# Patient Record
Sex: Female | Born: 1990 | Hispanic: Yes | Marital: Single | Smoking: Never smoker
Health system: Southern US, Community
[De-identification: ages and names within clinical notes are randomized; demographics above are authoritative.]

## PROBLEM LIST (undated history)

## (undated) DIAGNOSIS — D571 Sickle-cell disease without crisis: Secondary | ICD-10-CM

## (undated) DIAGNOSIS — J45909 Unspecified asthma, uncomplicated: Secondary | ICD-10-CM

## (undated) HISTORY — PX: CHOLECYSTECTOMY: SHX55

## (undated) HISTORY — PX: OTHER SURGICAL HISTORY: SHX169

## (undated) HISTORY — PX: PORT-A-CATH REMOVAL: SHX5289

## (undated) HISTORY — PX: INDUCED ABORTION: SHX677

---
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
## 2012-12-23 DIAGNOSIS — Q8901 Asplenia (congenital): Secondary | ICD-10-CM | POA: Insufficient documentation

## 2013-01-01 DIAGNOSIS — Q211 Atrial septal defect: Secondary | ICD-10-CM | POA: Insufficient documentation

## 2015-03-27 DIAGNOSIS — J45909 Unspecified asthma, uncomplicated: Secondary | ICD-10-CM | POA: Insufficient documentation

## 2015-08-19 ENCOUNTER — Inpatient Hospital Stay (HOSPITAL_COMMUNITY)
Admission: EM | Admit: 2015-08-19 | Discharge: 2015-08-22 | DRG: 812 | Disposition: A | Discharge status: Home / Self Care | Payer: Self-pay | Attending: Internal Medicine | Admitting: Internal Medicine

## 2015-08-19 ENCOUNTER — Encounter (HOSPITAL_COMMUNITY): Payer: Self-pay | Admitting: Emergency Medicine

## 2015-08-19 ENCOUNTER — Observation Stay (HOSPITAL_COMMUNITY): Payer: Self-pay

## 2015-08-19 ENCOUNTER — Emergency Department (HOSPITAL_COMMUNITY): Payer: Self-pay

## 2015-08-19 DIAGNOSIS — I959 Hypotension, unspecified: Secondary | ICD-10-CM | POA: Diagnosis present

## 2015-08-19 DIAGNOSIS — D72829 Elevated white blood cell count, unspecified: Secondary | ICD-10-CM | POA: Diagnosis present

## 2015-08-19 DIAGNOSIS — J45909 Unspecified asthma, uncomplicated: Secondary | ICD-10-CM | POA: Diagnosis present

## 2015-08-19 DIAGNOSIS — R0902 Hypoxemia: Secondary | ICD-10-CM | POA: Diagnosis present

## 2015-08-19 DIAGNOSIS — D571 Sickle-cell disease without crisis: Secondary | ICD-10-CM

## 2015-08-19 DIAGNOSIS — D638 Anemia in other chronic diseases classified elsewhere: Secondary | ICD-10-CM | POA: Diagnosis present

## 2015-08-19 DIAGNOSIS — E860 Dehydration: Secondary | ICD-10-CM | POA: Diagnosis present

## 2015-08-19 DIAGNOSIS — Z832 Family history of diseases of the blood and blood-forming organs and certain disorders involving the immune mechanism: Secondary | ICD-10-CM

## 2015-08-19 DIAGNOSIS — D5700 Hb-SS disease with crisis, unspecified: Principal | ICD-10-CM | POA: Diagnosis present

## 2015-08-19 DIAGNOSIS — T402X5A Adverse effect of other opioids, initial encounter: Secondary | ICD-10-CM | POA: Diagnosis present

## 2015-08-19 DIAGNOSIS — G43909 Migraine, unspecified, not intractable, without status migrainosus: Secondary | ICD-10-CM | POA: Diagnosis present

## 2015-08-19 HISTORY — DX: Sickle-cell disease without crisis: D57.1

## 2015-08-19 HISTORY — DX: Unspecified asthma, uncomplicated: J45.909

## 2015-08-19 LAB — POC URINE PREG, ED: Preg Test, Ur: NEGATIVE

## 2015-08-19 LAB — CBC WITH DIFFERENTIAL/PLATELET
BLASTS: 0 %
Band Neutrophils: 0 %
Basophils Absolute: 0.2 10*3/uL — ABNORMAL HIGH (ref 0.0–0.1)
Basophils Relative: 1 %
Eosinophils Absolute: 1.6 10*3/uL — ABNORMAL HIGH (ref 0.0–0.7)
Eosinophils Relative: 10 %
HEMATOCRIT: 19.7 % — AB (ref 36.0–46.0)
HEMOGLOBIN: 7.1 g/dL — AB (ref 12.0–15.0)
LYMPHS PCT: 40 %
Lymphs Abs: 6.2 10*3/uL — ABNORMAL HIGH (ref 0.7–4.0)
MCH: 32.7 pg (ref 26.0–34.0)
MCHC: 36 g/dL (ref 30.0–36.0)
MCV: 90.8 fL (ref 78.0–100.0)
METAMYELOCYTES PCT: 0 %
MONOS PCT: 6 %
Monocytes Absolute: 0.9 10*3/uL (ref 0.1–1.0)
Myelocytes: 2 %
NEUTROS ABS: 6.7 10*3/uL (ref 1.7–7.7)
Neutrophils Relative %: 41 %
OTHER: 0 %
PROMYELOCYTES ABS: 0 %
Platelets: 324 10*3/uL (ref 150–400)
RBC: 2.17 MIL/uL — AB (ref 3.87–5.11)
RDW: 29.7 % — ABNORMAL HIGH (ref 11.5–15.5)
WBC: 15.6 10*3/uL — AB (ref 4.0–10.5)
nRBC: 7 /100 WBC — ABNORMAL HIGH

## 2015-08-19 LAB — PREPARE RBC (CROSSMATCH)

## 2015-08-19 LAB — COMPREHENSIVE METABOLIC PANEL
ALT: 28 U/L (ref 14–54)
ANION GAP: 6 (ref 5–15)
AST: 75 U/L — ABNORMAL HIGH (ref 15–41)
Albumin: 4.4 g/dL (ref 3.5–5.0)
Alkaline Phosphatase: 76 U/L (ref 38–126)
BILIRUBIN TOTAL: 4.5 mg/dL — AB (ref 0.3–1.2)
BUN: 6 mg/dL (ref 6–20)
CO2: 25 mmol/L (ref 22–32)
Calcium: 9 mg/dL (ref 8.9–10.3)
Chloride: 108 mmol/L (ref 101–111)
Creatinine, Ser: <0.3 mg/dL — ABNORMAL LOW (ref 0.44–1.00)
Glucose, Bld: 108 mg/dL — ABNORMAL HIGH (ref 65–99)
POTASSIUM: 3.9 mmol/L (ref 3.5–5.1)
Sodium: 139 mmol/L (ref 135–145)
TOTAL PROTEIN: 7.8 g/dL (ref 6.5–8.1)

## 2015-08-19 LAB — URINALYSIS, ROUTINE W REFLEX MICROSCOPIC
Bilirubin Urine: NEGATIVE
Glucose, UA: NEGATIVE mg/dL
Hgb urine dipstick: NEGATIVE
Ketones, ur: NEGATIVE mg/dL
LEUKOCYTES UA: NEGATIVE
NITRITE: NEGATIVE
Protein, ur: NEGATIVE mg/dL
SPECIFIC GRAVITY, URINE: 1.013 (ref 1.005–1.030)
pH: 6 (ref 5.0–8.0)

## 2015-08-19 LAB — RETICULOCYTES
RBC.: 2.17 MIL/uL — ABNORMAL LOW (ref 3.87–5.11)
Retic Ct Pct: >23 % — ABNORMAL HIGH (ref 0.4–3.1)

## 2015-08-19 LAB — D-DIMER, QUANTITATIVE (NOT AT ARMC): D DIMER QUANT: 1.35 ug{FEU}/mL — AB (ref 0.00–0.50)

## 2015-08-19 LAB — LACTATE DEHYDROGENASE: LDH: 593 U/L — ABNORMAL HIGH (ref 98–192)

## 2015-08-19 MED ORDER — ALBUTEROL SULFATE (2.5 MG/3ML) 0.083% IN NEBU: 2.5000 mg | INHALATION_SOLUTION | Freq: Four times a day (QID) | RESPIRATORY_TRACT | Status: DC | PRN

## 2015-08-19 MED ORDER — SODIUM CHLORIDE 0.9 % IV BOLUS (SEPSIS)
1000.0000 mL | Freq: Once | INTRAVENOUS | Status: AC
  Administered 2015-08-19: 1000 mL via INTRAVENOUS

## 2015-08-19 MED ORDER — SENNOSIDES-DOCUSATE SODIUM 8.6-50 MG PO TABS
1.0000 | ORAL_TABLET | Freq: Two times a day (BID) | ORAL | Status: DC
  Administered 2015-08-19 – 2015-08-21 (×5): 1 via ORAL
  Filled 2015-08-19 (×6): qty 1

## 2015-08-19 MED ORDER — NALOXONE HCL 0.4 MG/ML IJ SOLN: 0.4000 mg | INTRAMUSCULAR | Status: DC | PRN

## 2015-08-19 MED ORDER — HYDROMORPHONE HCL 1 MG/ML IJ SOLN
1.0000 mg | Freq: Once | INTRAMUSCULAR | Status: AC
  Administered 2015-08-19: 1 mg via INTRAVENOUS
  Filled 2015-08-19: qty 1

## 2015-08-19 MED ORDER — ENOXAPARIN SODIUM 40 MG/0.4ML ~~LOC~~ SOLN
40.0000 mg | SUBCUTANEOUS | Status: DC
  Administered 2015-08-19 – 2015-08-21 (×3): 40 mg via SUBCUTANEOUS
  Filled 2015-08-19 (×3): qty 0.4

## 2015-08-19 MED ORDER — DIPHENHYDRAMINE HCL 25 MG PO CAPS: 25.0000 mg | ORAL_CAPSULE | Freq: Four times a day (QID) | ORAL | Status: DC | PRN

## 2015-08-19 MED ORDER — SODIUM CHLORIDE 0.9% FLUSH: 9.0000 mL | INTRAVENOUS | Status: DC | PRN

## 2015-08-19 MED ORDER — HYDROMORPHONE HCL 1 MG/ML IJ SOLN
1.0000 mg | INTRAMUSCULAR | Status: AC | PRN
  Administered 2015-08-19 (×2): 1 mg via INTRAVENOUS
  Filled 2015-08-19 (×2): qty 1

## 2015-08-19 MED ORDER — KETOROLAC TROMETHAMINE 30 MG/ML IJ SOLN
30.0000 mg | Freq: Once | INTRAMUSCULAR | Status: AC
  Administered 2015-08-19: 30 mg via INTRAVENOUS
  Filled 2015-08-19: qty 1

## 2015-08-19 MED ORDER — ONDANSETRON HCL 4 MG/2ML IJ SOLN
4.0000 mg | Freq: Once | INTRAMUSCULAR | Status: AC
  Administered 2015-08-19: 4 mg via INTRAVENOUS
  Filled 2015-08-19: qty 2

## 2015-08-19 MED ORDER — POLYETHYLENE GLYCOL 3350 17 G PO PACK: 17.0000 g | PACK | Freq: Every day | ORAL | Status: DC | PRN

## 2015-08-19 MED ORDER — DEXTROSE-NACL 5-0.45 % IV SOLN
INTRAVENOUS | Status: DC
  Administered 2015-08-19: 14:00:00 via INTRAVENOUS

## 2015-08-19 MED ORDER — HYDROMORPHONE HCL 1 MG/ML IJ SOLN
0.5000 mg | Freq: Once | INTRAMUSCULAR | Status: AC
  Administered 2015-08-19: 0.5 mg via INTRAVENOUS
  Filled 2015-08-19: qty 1

## 2015-08-19 MED ORDER — HYDROMORPHONE 1 MG/ML IV SOLN
INTRAVENOUS | Status: DC
  Administered 2015-08-19: 14:00:00 via INTRAVENOUS
  Administered 2015-08-19: 4 mg via INTRAVENOUS
  Administered 2015-08-19: 1 mg via INTRAVENOUS
  Administered 2015-08-19: 0.5 mg via INTRAVENOUS
  Administered 2015-08-20: 1.5 mg via INTRAVENOUS
  Administered 2015-08-20: 0.5 mg via INTRAVENOUS
  Administered 2015-08-20: 1.5 mg via INTRAVENOUS
  Filled 2015-08-19: qty 25

## 2015-08-19 MED ORDER — ONDANSETRON HCL 4 MG/2ML IJ SOLN
4.0000 mg | Freq: Four times a day (QID) | INTRAMUSCULAR | Status: DC | PRN
  Administered 2015-08-19 – 2015-08-22 (×7): 4 mg via INTRAVENOUS
  Filled 2015-08-19 (×7): qty 2

## 2015-08-19 MED ORDER — IOPAMIDOL (ISOVUE-370) INJECTION 76%
100.0000 mL | Freq: Once | INTRAVENOUS | Status: AC | PRN
  Administered 2015-08-19: 100 mL via INTRAVENOUS

## 2015-08-19 MED ORDER — HYDROXYUREA 500 MG PO CAPS
1000.0000 mg | ORAL_CAPSULE | Freq: Every day | ORAL | Status: DC
  Administered 2015-08-19 – 2015-08-20 (×2): 1000 mg via ORAL
  Filled 2015-08-19 (×2): qty 2

## 2015-08-19 MED ORDER — HYDROCODONE-ACETAMINOPHEN 5-325 MG PO TABS
1.0000 | ORAL_TABLET | Freq: Once | ORAL | Status: AC
  Administered 2015-08-19: 1 via ORAL
  Filled 2015-08-19: qty 1

## 2015-08-19 MED ORDER — KETOROLAC TROMETHAMINE 30 MG/ML IJ SOLN
30.0000 mg | Freq: Four times a day (QID) | INTRAMUSCULAR | Status: DC
  Administered 2015-08-19 – 2015-08-22 (×11): 30 mg via INTRAVENOUS
  Filled 2015-08-19 (×11): qty 1

## 2015-08-19 NOTE — ED Notes (Signed)
Pt can go to room at 09:35.

## 2015-08-19 NOTE — ED Notes (Signed)
Pt vomited x 1 while in Xray.  Pt reports that back pain is shooting into R posterior head/behind R ear.  Sts this pain is "normal" when she is in crisis.

## 2015-08-19 NOTE — ED Provider Notes (Addendum)
PROGRESS NOTE                                                                                                                 This is a sign-out from NP Tomasa BlaseSchultz at shift change: Valerie Reid is a 25 y.o. 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 Patient afebrile, no signs of acute chest. Plan is to follow-up blood work and reassess pain.  Please refer to previous note for full HPI, ROS, PMH and PE.   Informed by RN the patient's blood pressure is soft with systolic in the upper 80s, chart review and care everywhere with review of [facility] shows that her systolic is always low in the low 90s. Gave the verbal okay to administer Dilaudid.   7:15 AM: Patent seen and evaluated the bedside, she is resting comfortably. She states that her pain is improved from 10 out of 10-8 out of 10, it in the bilateral thighs which is typical for her sickle cell pain crises. She's had 2 doses of Dilaudid so far, she is on 2 L via nasal cannula, stats that this was started because she felt short of breath. I have removed the oxygen, she is talking to me and alert, her oxygen saturation has dipped to he mid 80s, she is asymptomatic, oxygen is reapplied, given a verbal command to hold any more narcotics, will give Zofran she states she's feeling nauseous and obtain a chest x-ray, patient denies cough, chest pain.   Patient states that her care is at [facility], [place] and has been followed there on her life, [place] and for continuity of care she continues to get care [facility].  Chest x-ray with no acute abnormality, when patient returned from chest x-ray as per nurse her O2 sats were 74% on room air with good waveform. Patient does not know her baseline hemoglobin, chart review shows she  was 8.1 in March 2017, given this patient's lack of chest pain, palpitation, physical exam is not consistent with DVT, I doubt this is a PE, I topics in this patient's best interest to expose her to the radiation to evaluate this. I think her hypoxia is likely secondary to her low hemoglobin from acute sickle cell crises, will transfuse and admit.   Discussed with attending physician who agrees with care plan.   Discussed case with triad hospitalist Dr. Dartha Lodgegbata, who accepts admission to a telemetry bed, request that we draw a d-dimer in the ED which she will follow-up.  Discussed with Dr. Ashley RoyaltyMatthews at Dr. Tennis Mustbota's request, she states that she does sickle cell patients, states that her hypoxia is likely not secondary to her anemia and she is not tachycardic request that we further workup for hypoxia with d-dimer and call again for admission when we have ruled in or out PE.  Gave verbal request for nurse to hold PRBCs. D-dimer elevated, will obtain CT angio.   CT and it was negative for PE, they do show some chronic swelling. Discussed with Dr. Ashley Royalty who accepts admission to a non-telemetry bed, patient will be given orders for MedSurg, will put in for oxygen and cardiac monitoring with pulse ox monitoring.   Wynetta Emery, PA-C 08/19/15 [id number]  Dione Booze, MD 08/19/15 [id number]  Wynetta Emery, PA-C 08/19/15 1144  Gerhard Munch, MD 08/19/15 [phone]

## 2015-08-19 NOTE — ED Notes (Signed)
Per Joni ReiningNicole PA, Pt dropped to 80% O2 on room air.

## 2015-08-19 NOTE — ED Notes (Signed)
Per Joni ReiningNicole PA and Hospitalist, Pt will hold in the ED until after CT Angio results.

## 2015-08-19 NOTE — ED Notes (Signed)
Patient transported to X-ray 

## 2015-08-19 NOTE — Progress Notes (Signed)
UR completed [employer] [facility] [employer] 

## 2015-08-19 NOTE — H&P (Signed)
Hospital Admission Note Date: 08/19/2015  Patient name: Valerie Reid Medical record number: [id number] Date of birth: 1990-03-15 Age: 25 y.o. Gender: female PCP: No PCP Per Patient  Attending physician: Altha Harm, MD  Chief Complaint: Pain in B/L "sides", back and bilateral lower extremities 1 week.  History of Present Illness: The patient with hemoglobin SS was normally followed at [facility] and due to our hospital. She presents today with complaints of pain in her bilateral rib, low back and bilateral legs. She rates pain currently at 8/10 and states that the highest intensity has been 10/10. She describes the pain as throbbing in nature and intermittently sharp. Pain is nonradiating and she was unsuccessful in decreasing her pain with the use of her oral opiates. The patient is essentially opiate nave and takes her oral Dilaudid 2 mg on a very infrequent as-needed basis. Otherwise she uses Aleve for relief of her pain. She reports that her pain at baseline is usually 3/10. Patient also reports that she had one episode of emesis this morning which was self-limited and she is able to tolerate oral intake currently. She's had no fever, chills, nausea, diarrhea, chest pain, headaches or focal neurological changes.  In the emergency department she was reported by the attending nurse practitioner to have a hypoxemia of 74% without oxygen. However during my visit with the patient her oxygen saturations remained 92% on room. She had a d-dimer performed which was elevated 1.35 and a subsequent CT angiogram to evaluate for pulmonary embolus. The CT angiographic was negative for any evidence of pulmonary embolus.  A review of her records from [facility] shows that her hemoglobin is at baseline at around 7.1. 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 Her record shows that she's had a history of acute chest syndrome in the past and at that time was transfused several units of blood. The patient has not received a blood transfusion since February 2016.  Scheduled Meds: Continuous Infusions: PRN Meds:. Allergies: Review of patient's allergies indicates no known allergies. Past Medical History  Diagnosis Date  . Sickle cell anemia (HCC)   . Asthma    Past Surgical History  Procedure Laterality Date  . Cholecystectomy    . Port-a-cath removal    . Port-a-cath insertion     . Induced abortion N/A    Family History  Problem Relation Age of Onset  . Sickle cell anemia Sister   . Sickle cell trait Mother   . Sickle cell trait Father    Social History   Social History  . Marital Status: Single    Spouse Name: N/A  . Number of Children: N/A  . Years of Education: N/A   Occupational History  . Not on file.   Social History Main Topics  . Smoking status: Never Smoker   . Smokeless tobacco: Not on file  . Alcohol Use: No  . Drug Use: No  . Sexual Activity: Yes   Other Topics Concern  . Not on file   Social History Narrative  . No narrative on file   Review of Systems: A comprehensive review of systems was negative except as noted in the history of present illness.  Physical Exam: No intake or output data in the 24 hours ending 08/19/15 1226 General: Alert, awake, oriented x3, in mild distress secondary to pain.  HEENT: [place]/AT PEERL, EOMI, mild icterus  present Neck: Trachea midline,  no masses, no thyromegal,y no JVD, no carotid bruit OROPHARYNX:  Moist, No exudate/ erythema/lesions.  Heart: Regular rate and rhythm, without murmurs, rubs, gallops, PMI non-displaced, no heaves or thrills on palpation.  Lungs: Clear to auscultation, no wheezing or rhonchi noted. No increased vocal fremitus resonant to percussion. Abdomen: Soft, nontender, nondistended, positive bowel sounds, no masses no hepatosplenomegaly  noted.  Neuro: No focal neurological deficits noted cranial nerves II through XII grossly intact.  Strength at functional baseline in bilateral upper and lower extremities. Musculoskeletal: No warmth swelling or erythema around joints, no spinal tenderness noted. Psychiatric: Patient alert and oriented x3, good insight and cognition, good recent to remote recall.   Lab results:  Recent Labs  08/19/15 0445  NA 139  K 3.9  CL 108  CO2 25  GLUCOSE 108*  BUN 6  CREATININE <0.30*  CALCIUM 9.0    Recent Labs  08/19/15 0445  AST 75*  ALT 28  ALKPHOS 76  BILITOT 4.5*  PROT 7.8  ALBUMIN 4.4   No results for input(s): LIPASE, AMYLASE in the last 72 hours.  Recent Labs  08/19/15 0445  WBC 15.6*  NEUTROABS 6.7  HGB 7.1*  HCT 19.7*  MCV 90.8  PLT 324   No results for input(s): CKTOTAL, CKMB, CKMBINDEX, TROPONINI in the last 72 hours. Invalid input(s): POCBNP  Recent Labs  08/19/15 0445  DDIMER 1.35*   No results for input(s): HGBA1C in the last 72 hours. No results for input(s): CHOL, HDL, LDLCALC, TRIG, CHOLHDL, LDLDIRECT in the last 72 hours. No results for input(s): TSH, T4TOTAL, T3FREE, THYROIDAB in the last 72 hours.  Invalid input(s): FREET3  Recent Labs  08/19/15 0445  RETICCTPCT >23.0*   Imaging results:  Dg Chest 2 View  08/19/2015  CLINICAL DATA:  Sickle sent pain/ crisis with worsening last 2 days EXAM: CHEST  2 VIEW COMPARISON:  03/27/2015 FINDINGS: Cardiomediastinal silhouette is stable. Again noted bilateral upper lobe suprahilar streaky scarring and some architectural distortion. Stable streaky scarring right infrahilar region. No definite superimposed infiltrate or pulmonary edema. No pleural effusion or pneumothorax. Bony thorax is unremarkable. IMPRESSION: Again noted bilateral upper lobe suprahilar streaky scarring and some architectural distortion. Stable streaky scarring right infrahilar region. No definite superimposed infiltrate or pulmonary  edema. Electronically Signed   By: Natasha MeadLiviu  Pop M.D.   On: 08/19/2015 07:58   Ct Angio Chest Pe W/cm &/or Wo Cm  08/19/2015  CLINICAL DATA:  Chronic sickle cell pain, elevated D-dimer EXAM: CT ANGIOGRAPHY CHEST WITH CONTRAST TECHNIQUE: Multidetector CT imaging of the chest was performed using the standard protocol during bolus administration of intravenous contrast. Multiplanar CT image reconstructions and MIPs were obtained to evaluate the vascular anatomy. CONTRAST:  100 cc Isovue COMPARISON:  None. FINDINGS: Images of the thoracic inlet are unremarkable. Central airways are patent. No mediastinal hematoma or adenopathy. Borderline cardiomegaly. No pulmonary embolus is noted. Precarinal lymph node measures 7 mm short-axis. Right hilar lymph node measures 9 mm short-axis. Left hilar lymph node measures 9 mm short-axis. These are borderline enlarged by size criteria. No pulmonary embolus is noted.  There is no aortic aneurysm. Images of the lung parenchyma shows architectural distortion bilateral upper lobes. There is linear interstitial thickening traction and cylindrical bronchiectasis in right upper lobe anteriorly. Pleural parenchymal scarring/ fibrotic changes are noted also in left apex. Similar fibrotic changes with linear interstitial thickening in right lower lobe posterior laterally please see axial image 44. There is no segmental  infiltrate or pulmonary edema. Air trapping noted in right lower lobe medially. Findings are suspicious for fibrotic changes and sequela from prior infection or infarction in this patient with known sickle cell disease and probable prior pulmonary infection. No evidence of interstitial nodularity to suggest sore choroid. Clinical correlation is necessary. There is no evidence of pulmonary edema. Sagittal images of the spine shows no destructive bony lesions. There are sickle cell sequela with H-shaped vertebrae T5 vertebral body T8 and T9 vertebral body. 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 Review of the MIP images confirms the above findings. IMPRESSION: 1. No pulmonary embolus is noted. 2. Borderline cardiomegaly.  Borderline bilateral hilar adenopathy. 3. There is architectural distortion and bandlike fibrotic changes bilateral upper lobe and right lower lobe. Some traction and cylindrical bronchiectasis noted in right upper lobe anteriorly. Pleural parenchymal fibrotic changes noted in left upper lobe/apex with some central bronchiectasis. Findings highly suspicious for fibrotic changes probable sequelae from prior pulmonary infections or lung infarct in this patient with known sickle cell disease. Less likely sarcoid. Clinical correlation is necessary. Air trapping noted in right lower lobe medially. 4. No segmental infiltrate or pulmonary edema. 5. There are sickle cell sequela with H-shaped vertebrae T5 vertebral body T8 and T9 vertebral body. 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 Electronically Signed   By: Natasha Mead M.D.   On: 08/19/2015 11:28    Assessment and Plan:  Hb SS with Crisis: Patient's pain is markedly elevated at this time. She has received 2 doses of Dilaudid and 1 dose of Toradol in the emergency room. I will start the patient on a Dilaudid PCA with a PCA bolus of 0.5 mg, 10 minute lockout and one hour total of 3 mg. I will also continue Toradol and IV fluids.  Anemia of chronic disease: Clinical examination is consistent with acute hemolysis. I will check an LDH level for baseline. Her hemoglobin is at baseline however she does have a robust reticulocytosis thus I do not expect a significant drop in her hemoglobin.  Mild dehydration: Patient's clinical examination is consistent with a mild degree of dehydration. She's received significant amount of fluids and I will continue her fluids until she is able to maintain her hydration orally. She's had one episode of emesis and  no far no further emesis. Will place on a regular diet.  DVT prophylaxis: Subcutaneous Lovenox.   Hypoxemia: I suspect that her hypoxemia is in large part due to hypoventilation. We'll ensure that she has an incentive spirometer with appropriate use. We'll continue to supplement her oxygen for now and reassess her oxygenation in the morning.  Total time spent 1 hour. Greater than 50% of the time spent in assessment, counseling and coordination of care.   Daliah Chaudoin A. 08/19/2015, 12:26 PM

## 2015-08-19 NOTE — ED Notes (Signed)
Patient transported to CT 

## 2015-08-19 NOTE — ED Provider Notes (Signed)
CSN: [id number]     Arrival date & time 08/19/15  0344 History   First MD Initiated Contact with Patient 08/19/15 0450     Chief Complaint  Patient presents with  . Sickle Cell Pain Crisis     (Consider location/radiation/quality/duration/timing/severity/associated sxs/prior Treatment) HPI Comments: This a 25 year old female with reported history of sickle cell disease.  She is unsure which type.  She states that she has not had to be hospitalized in a year for crisis.  She is [facility] clinic [facility] where she sees her physician every 3 months.  She is due to be seen again in July.  She states for the past 2 days.  She's had extremity back pain which is typical of her crises.  She has been taken her Norco Dilaudid tablets.  Hydrea as well as folic acid, without relief.  She states she has asthma but is not short of breath at this time.  She denies any chest pain.  She has no history of chest syndrome  Patient is a 25 y.o. female presenting with sickle cell pain. The history is provided by the patient.  Sickle Cell Pain Crisis Location:  Upper extremity and lower extremity Severity:  Moderate Onset quality:  Gradual Duration:  2 days Similar to previous crisis episodes: yes   Timing:  Constant Progression:  Worsening Chronicity:  Chronic Sickle cell genotype:  Unable to specify History of pulmonary emboli: no   Context: not change in medication, not dehydration, not infection, not non-compliance, not menses and not pregnancy   Associated symptoms: no chest pain, no cough, no fever and no shortness of breath     Past Medical History  Diagnosis Date  . Sickle cell anemia (HCC)   . Asthma    Past Surgical History  Procedure Laterality Date  . Cholecystectomy    . Port-a-cath removal    . Port-a-cath insertion     . Induced abortion N/A    Family History  Problem Relation Age of Onset  . Sickle cell anemia Sister   .  Sickle cell trait Mother   . Sickle cell trait Father    Social History  Substance Use Topics  . Smoking status: Never Smoker   . Smokeless tobacco: None  . Alcohol Use: No   OB History    No data available     Review of Systems  Constitutional: Negative for fever and chills.  Respiratory: Negative for cough and shortness of breath.   Cardiovascular: Negative for chest pain.  Gastrointestinal: Negative for abdominal pain.  Musculoskeletal: Positive for arthralgias. Negative for joint swelling.  Skin: Negative for rash and wound.  All other systems reviewed and are negative.     Allergies  Review of patient's allergies indicates no known allergies.  Home Medications   Prior to Admission medications   Medication Sig Start Date End Date Taking? Authorizing Provider  albuterol (PROVENTIL HFA;VENTOLIN HFA) 108 (90 Base) MCG/ACT inhaler Inhale 1-2 puffs into the lungs every 6 (six) hours as needed for wheezing or shortness of breath.   Yes Historical Provider, MD  HYDROcodone-acetaminophen (NORCO) 10-325 MG tablet Take 1 tablet by mouth every 6 (six) hours as needed for moderate pain.   Yes Historical Provider, MD  HYDROmorphone HCl (DILAUDID PO) Take 2 mg by mouth every 6 (six) hours as needed (pain).    Yes Historical Provider, MD  hydroxyurea (HYDREA) 500 MG capsule Take 1,500 mg by mouth daily. May take  with food to minimize GI side effects.   Yes Historical Provider, MD  OXYCODONE ER PO Take 1 tablet by mouth every 4 (four) hours.   Yes Historical Provider, MD   BP 87/49 mmHg  Pulse 57  Temp(Src) 98.7 F (37.1 C) (Oral)  Resp 14  Ht 5\' 2"  (1.575 m)  Wt 49.714 kg  BMI 20.04 kg/m2  SpO2 100%  LMP 08/08/2015 (Exact Date) Physical Exam  Constitutional: She is oriented to person, place, and time. She appears well-developed and well-nourished.  HENT:  Head: Normocephalic.  Eyes: Pupils are equal, round, and reactive to light.  Neck: Normal range of motion.   Cardiovascular: Normal rate and regular rhythm.   Pulmonary/Chest: Effort normal and breath sounds normal.  Abdominal: Soft. Bowel sounds are normal.  Musculoskeletal: Normal range of motion. She exhibits no edema or tenderness.  Neurological: She is alert and oriented to person, place, and time.  Skin: Skin is warm and dry.  Psychiatric: She has a normal mood and affect.  Nursing note and vitals reviewed.   ED Course  Procedures (including critical care time) Labs Review Labs Reviewed  COMPREHENSIVE METABOLIC PANEL - Abnormal; Notable for the following:    Glucose, Bld 108 (*)    Creatinine, Ser <0.30 (*)    AST 75 (*)    Total Bilirubin 4.5 (*)    All other components within normal limits  CBC WITH DIFFERENTIAL/PLATELET - Abnormal; Notable for the following:    WBC 15.6 (*)    RBC 2.17 (*)    Hemoglobin 7.1 (*)    HCT 19.7 (*)    RDW 29.7 (*)    nRBC 7 (*)    Lymphs Abs 6.2 (*)    Eosinophils Absolute 1.6 (*)    Basophils Absolute 0.2 (*)    All other components within normal limits  RETICULOCYTES - Abnormal; Notable for the following:    Retic Ct Pct >23.0 (*)    RBC. 2.17 (*)    All other components within normal limits  URINALYSIS, ROUTINE W REFLEX MICROSCOPIC (NOT AT [facility]) - Abnormal; Notable for the following:    Color, Urine AMBER (*)    APPearance CLOUDY (*)    All other components within normal limits  D-DIMER, QUANTITATIVE (NOT AT [facility]) - Abnormal; Notable for the following:    D-Dimer, Quant 1.35 (*)    All other components within normal limits  LACTATE DEHYDROGENASE - Abnormal; Notable for the following:    LDH 593 (*)    All other components within normal limits  CBC WITH DIFFERENTIAL/PLATELET - Abnormal; Notable for the following:    RBC 1.95 (*)    Hemoglobin 6.3 (*)    HCT 17.2 (*)    MCHC 36.6 (*)    RDW 28.6 (*)    nRBC 28 (*)    Monocytes Absolute 1.4 (*)    All other components within normal limits  CBC WITH DIFFERENTIAL/PLATELET -  Abnormal; Notable for the following:    RBC 1.85 (*)    Hemoglobin 5.9 (*)    HCT 16.4 (*)    RDW 28.7 (*)    nRBC 61 (*)    Lymphs Abs 4.8 (*)    All other components within normal limits  RETICULOCYTES - Abnormal; Notable for the following:    Retic Ct Pct >23.0 (*)    RBC. 1.85 (*)    All other components within normal limits  LACTATE DEHYDROGENASE - Abnormal; Notable for the following:    LDH 579 (*)  All other components within normal limits  CBC WITH DIFFERENTIAL/PLATELET  COMPREHENSIVE METABOLIC PANEL  POC URINE PREG, ED  TYPE AND SCREEN  PREPARE RBC (CROSSMATCH)  PREPARE RBC (CROSSMATCH)    Imaging Review No results found. I have personally reviewed and evaluated these images and lab results as part of my medical decision-making.   EKG Interpretation None      MDM   Final diagnoses:  Hypoxia  Hb-SS disease without crisis (HCC)         Earley Favor, NP 08/21/15 2026  Dione Booze, MD 08/23/15 [phone]

## 2015-08-19 NOTE — ED Notes (Signed)
Pt states she is has sickle cell and feels like she is in crisis  Pt states the pain is really bad in her lower back, sides and in her legs when she walks  Pt states her pain started 2 days ago  Pt states she took dilaudid at home without relief

## 2015-08-19 NOTE — ED Notes (Signed)
PA at bedside.

## 2015-08-20 DIAGNOSIS — G43909 Migraine, unspecified, not intractable, without status migrainosus: Secondary | ICD-10-CM

## 2015-08-20 LAB — PREPARE RBC (CROSSMATCH)

## 2015-08-20 LAB — CBC WITH DIFFERENTIAL/PLATELET
BASOS PCT: 1 %
Basophils Absolute: 0.1 10*3/uL (ref 0.0–0.1)
EOS PCT: 6 %
Eosinophils Absolute: 0.6 10*3/uL (ref 0.0–0.7)
HEMATOCRIT: 17.2 % — AB (ref 36.0–46.0)
HEMOGLOBIN: 6.3 g/dL — AB (ref 12.0–15.0)
LYMPHS ABS: 3.6 10*3/uL (ref 0.7–4.0)
LYMPHS PCT: 37 %
MCH: 32.3 pg (ref 26.0–34.0)
MCHC: 36.6 g/dL — ABNORMAL HIGH (ref 30.0–36.0)
MCV: 88.2 fL (ref 78.0–100.0)
MONO ABS: 1.4 10*3/uL — AB (ref 0.1–1.0)
Monocytes Relative: 14 %
NRBC: 28 /100{WBCs} — AB
Neutro Abs: 4 10*3/uL (ref 1.7–7.7)
Neutrophils Relative %: 42 %
Platelets: ADEQUATE 10*3/uL (ref 150–400)
RBC: 1.95 MIL/uL — ABNORMAL LOW (ref 3.87–5.11)
RDW: 28.6 % — AB (ref 11.5–15.5)
WBC: 9.7 10*3/uL (ref 4.0–10.5)

## 2015-08-20 MED ORDER — DEXTROSE-NACL 5-0.45 % IV SOLN
INTRAVENOUS | Status: DC
  Administered 2015-08-20 – 2015-08-22 (×3): via INTRAVENOUS

## 2015-08-20 MED ORDER — SODIUM CHLORIDE 0.9 % IV SOLN: Freq: Once | INTRAVENOUS | Status: DC

## 2015-08-20 MED ORDER — HYDROMORPHONE 1 MG/ML IV SOLN
INTRAVENOUS | Status: DC
  Administered 2015-08-20: 0 mg via INTRAVENOUS
  Administered 2015-08-21: 0.6 mg via INTRAVENOUS
  Administered 2015-08-21: 2.1 mg via INTRAVENOUS
  Administered 2015-08-21: 0.6 mg via INTRAVENOUS
  Administered 2015-08-21: 1.5 mg via INTRAVENOUS
  Administered 2015-08-21: 0 mg via INTRAVENOUS
  Administered 2015-08-21: 1.5 mg via INTRAVENOUS
  Administered 2015-08-22: 0 mg via INTRAVENOUS
  Administered 2015-08-22: 0.9 mg via INTRAVENOUS
  Administered 2015-08-22: 2.1 mg via INTRAVENOUS

## 2015-08-20 MED ORDER — SODIUM CHLORIDE 0.9 % IV BOLUS (SEPSIS): 1000.0000 mL | Freq: Once | INTRAVENOUS | Status: AC

## 2015-08-20 MED ORDER — HYDROMORPHONE HCL 2 MG PO TABS
2.0000 mg | ORAL_TABLET | Freq: Four times a day (QID) | ORAL | Status: DC
  Administered 2015-08-20 – 2015-08-22 (×7): 2 mg via ORAL
  Filled 2015-08-20 (×8): qty 1

## 2015-08-20 MED ORDER — SUMATRIPTAN SUCCINATE 50 MG PO TABS
50.0000 mg | ORAL_TABLET | Freq: Once | ORAL | Status: AC
  Administered 2015-08-20: 50 mg via ORAL
  Filled 2015-08-20: qty 1

## 2015-08-20 MED ORDER — PROMETHAZINE HCL 25 MG PO TABS
12.5000 mg | ORAL_TABLET | Freq: Once | ORAL | Status: AC
  Administered 2015-08-20: 12.5 mg via ORAL
  Filled 2015-08-20: qty 1

## 2015-08-20 MED ORDER — SODIUM CHLORIDE 0.9 % IV BOLUS (SEPSIS)
1000.0000 mL | Freq: Once | INTRAVENOUS | Status: AC
  Administered 2015-08-20: 1000 mL via INTRAVENOUS

## 2015-08-20 MED ORDER — ACETAMINOPHEN 325 MG PO TABS
650.0000 mg | ORAL_TABLET | ORAL | Status: DC | PRN
  Administered 2015-08-20 – 2015-08-22 (×5): 650 mg via ORAL
  Filled 2015-08-20 (×5): qty 2

## 2015-08-20 NOTE — Progress Notes (Signed)
Patient ID: Valerie Reid, female   DOB: 1990-03-30, 25 y.o.   MRN: [id number] Called by nurse for patient with low blood pressure and respirations. Arrived at bedside and patient awake, and oriented x 3. States that she is just waking up and actually feels good. She does admit to some mild dizziness with sitting up. But states that her pain is better.   Pt has used only 1.5 mg of Dilaudid in the last 2 hours. She ash had good urine output for today.   Focused Examination: Repeat BP in sitting 97/58, HR 82, RR 9 and Saturations 98% on 3 L/min. Gen: Pt awake and oriented x 3. Initially sleepy but then fully awake and engaging in conversation.  HEENT: Mild icterus. PERRLA, Pupils 3mm in size. Lungs: CTA COR: S1 S2 normal EXT: Warm and dry and capillary refill <3 seconds.  A/P : Transient Hypotension: A [facility] shows BP normally 97-98/50-54. BP now back to baseline. Likely multifactorial due to patient sleeping, and effects of opiates.  Will decrease PCA bolus and continue IVF. Check Hb and order RBC's prepared in event of need for transfusion.  Continue with plan as previously outlined.   Kaydon Husby A.

## 2015-08-20 NOTE — Progress Notes (Signed)
SICKLE CELL SERVICE PROGRESS NOTE  Valerie Reid ZOX:[id number] DOB: 02/06/1991 DOA: 08/19/2015 PCP: No PCP Per Patient  Assessment/Plan: Active Problems:   Hypoxia   Sickle cell crisis (HCC)   Hb-SS disease with crisis (HCC)   Anemia of chronic disease   Mild dehydration  1. Hb SS with crisis: Will schedule oral Dilaudid and continue PCA for PRN use. Continue Toradol and IVF since she has still had some emesis and poor oral intake.  2. Migraine Headache: Tylenol prescribed. If no relief, will give Imitrex.  3. Leukocytosis: Likely related to crisis. No evidence of infection.  4. Anemia of chronic Disease: Check Hb tomorrow.   Code Status: Full Code Family Communication: N/A Disposition Plan: Not yet ready for discharge  MATTHEWS,MICHELLE A.  Pager [phone]. If 7PM-7AM, please contact night-coverage.  08/20/2015, 11:14 AM  LOS: 1 day   Interim History: Pt reports a severe headache which is worsened by IV Dilaudid. She also reports that her nausea is worse after Dilaudid and she has had 2 episodes of emesis since yesterday. She has used only 5 mg with 10/10:semands/deliveries since yesterday.   Consultants:  None  Procedures:  None  Antibiotics:  None   Objective: Filed Vitals:   08/20/15 0345 08/20/15 0628 08/20/15 0755 08/20/15 1008  BP:  95/66  94/48  Pulse:  82  72  Temp:  98.2 F (36.8 C)  98.7 F (37.1 C)  TempSrc:  Oral  Oral  Resp: 18 11 10 10   Height:      Weight:      SpO2: 94% 97% 95% 96%   Weight change: 0 lb (0 kg)  Intake/Output Summary (Last 24 hours) at 08/20/15 1114 Last data filed at 08/20/15 1041  Gross per 24 hour  Intake      0 ml  Output   1000 ml  Net  -1000 ml    General: Alert, awake, oriented x3, in moderate distress secondary to pain and nausea.  HEENT: [place]/AT PEERL, EOMI, anicteric. Neck: Trachea midline,  no masses, no thyromegal,y no JVD, no carotid bruit OROPHARYNX:  Moist, No exudate/ erythema/lesions.  Heart:  Regular rate and rhythm, without murmurs, rubs, gallops, PMI non-displaced, no heaves or thrills on palpation.  Lungs: Clear to auscultation, no wheezing or rhonchi noted. No increased vocal fremitus resonant to percussion  Abdomen: Soft, nontender, nondistended, positive bowel sounds, no masses no hepatosplenomegaly noted..  Neuro: No focal neurological deficits noted cranial nerves II through XII grossly intact.  Strength at functional baseline in bilateral upper and lower extremities. Musculoskeletal: No warm swelling or erythema around joints, no spinal tenderness noted. Psychiatric: Patient alert and oriented x3, good insight and cognition, good recent to remote recall.    Data Reviewed: Basic Metabolic Panel:  Recent Labs Lab 08/19/15 0445  NA 139  K 3.9  CL 108  CO2 25  GLUCOSE 108*  BUN 6  CREATININE <0.30*  CALCIUM 9.0   Liver Function Tests:  Recent Labs Lab 08/19/15 0445  AST 75*  ALT 28  ALKPHOS 76  BILITOT 4.5*  PROT 7.8  ALBUMIN 4.4   No results for input(s): LIPASE, AMYLASE in the last 168 hours. No results for input(s): AMMONIA in the last 168 hours. CBC:  Recent Labs Lab 08/19/15 0445  WBC 15.6*  NEUTROABS 6.7  HGB 7.1*  HCT 19.7*  MCV 90.8  PLT 324   Cardiac Enzymes: No results for input(s): CKTOTAL, CKMB, CKMBINDEX, TROPONINI in the last 168 hours. BNP (last 3 results) No  results for input(s): BNP in the last 8760 hours.  ProBNP (last 3 results) No results for input(s): PROBNP in the last 8760 hours.  CBG: No results for input(s): GLUCAP in the last 168 hours.  No results found for this or any previous visit (from the past 240 hour(s)).   Studies: Dg Chest 2 View  08/19/2015  CLINICAL DATA:  Sickle sent pain/ crisis with worsening last 2 days EXAM: CHEST  2 VIEW COMPARISON:  03/27/2015 FINDINGS: Cardiomediastinal silhouette is stable. Again noted bilateral upper lobe suprahilar streaky scarring and some architectural distortion.  Stable streaky scarring right infrahilar region. No definite superimposed infiltrate or pulmonary edema. No pleural effusion or pneumothorax. Bony thorax is unremarkable. IMPRESSION: Again noted bilateral upper lobe suprahilar streaky scarring and some architectural distortion. Stable streaky scarring right infrahilar region. No definite superimposed infiltrate or pulmonary edema. Electronically Signed   By: Natasha Mead M.D.   On: 08/19/2015 07:58   Ct Angio Chest Pe W/cm &/or Wo Cm  08/19/2015  CLINICAL DATA:  Chronic sickle cell pain, elevated D-dimer EXAM: CT ANGIOGRAPHY CHEST WITH CONTRAST TECHNIQUE: Multidetector CT imaging of the chest was performed using the standard protocol during bolus administration of intravenous contrast. Multiplanar CT image reconstructions and MIPs were obtained to evaluate the vascular anatomy. CONTRAST:  100 cc Isovue COMPARISON:  None. FINDINGS: Images of the thoracic inlet are unremarkable. Central airways are patent. No mediastinal hematoma or adenopathy. Borderline cardiomegaly. No pulmonary embolus is noted. Precarinal lymph node measures 7 mm short-axis. Right hilar lymph node measures 9 mm short-axis. Left hilar lymph node measures 9 mm short-axis. These are borderline enlarged by size criteria. No pulmonary embolus is noted.  There is no aortic aneurysm. Images of the lung parenchyma shows architectural distortion bilateral upper lobes. There is linear interstitial thickening traction and cylindrical bronchiectasis in right upper lobe anteriorly. Pleural parenchymal scarring/ fibrotic changes are noted also in left apex. Similar fibrotic changes with linear interstitial thickening in right lower lobe posterior laterally please see axial image 44. There is no segmental infiltrate or pulmonary edema. Air trapping noted in right lower lobe medially. Findings are suspicious for fibrotic changes and sequela from prior infection or infarction in this patient with known sickle  cell disease and probable prior pulmonary infection. No evidence of interstitial nodularity to suggest sore choroid. Clinical correlation is necessary. There is no evidence of pulmonary edema. Sagittal images of the spine shows no destructive bony lesions. There are sickle cell sequela with H-shaped vertebrae T5 vertebral body T8 and T9 vertebral body. 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 Review of the MIP images confirms the above findings. IMPRESSION: 1. No pulmonary embolus is noted. 2. Borderline cardiomegaly.  Borderline bilateral hilar adenopathy. 3. There is architectural distortion and bandlike fibrotic changes bilateral upper lobe and right lower lobe. Some traction and cylindrical bronchiectasis noted in right upper lobe anteriorly. Pleural parenchymal fibrotic changes noted in left upper lobe/apex with some central bronchiectasis. Findings highly suspicious for fibrotic changes probable sequelae from prior pulmonary infections or lung infarct in this patient with known sickle cell disease. Less likely sarcoid. Clinical correlation is necessary. Air trapping noted in right lower lobe medially. 4. No segmental infiltrate or pulmonary edema. 5. There are sickle cell sequela with H-shaped vertebrae T5 vertebral body T8 and T9 vertebral body. 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 Electronically Signed   By: Natasha Mead M.D.   On: 08/19/2015 11:28  Scheduled Meds: . enoxaparin (LOVENOX) injection  40 mg Subcutaneous Q24H  . HYDROmorphone   Intravenous Q4H  . hydroxyurea  1,000 mg Oral Daily  . ketorolac  30 mg Intravenous Q6H  . senna-docusate  1 tablet Oral BID  . SUMAtriptan  50 mg Oral Once   Continuous Infusions: . dextrose 5 % and 0.45% NaCl 75 mL/hr at 08/19/15 1333    Time spent 30 minutes and greater than  50% involved face to face contact with the patient for assessment, counseling and coordination of  care

## 2015-08-21 LAB — CBC WITH DIFFERENTIAL/PLATELET
BASOS ABS: 0 10*3/uL (ref 0.0–0.1)
BASOS PCT: 0 %
EOS ABS: 0.3 10*3/uL (ref 0.0–0.7)
Eosinophils Relative: 4 %
HCT: 16.4 % — ABNORMAL LOW (ref 36.0–46.0)
Hemoglobin: 5.9 g/dL — CL (ref 12.0–15.0)
LYMPHS ABS: 4.8 10*3/uL — AB (ref 0.7–4.0)
LYMPHS PCT: 57 %
MCH: 31.9 pg (ref 26.0–34.0)
MCHC: 36 g/dL (ref 30.0–36.0)
MCV: 88.6 fL (ref 78.0–100.0)
MONO ABS: 0.7 10*3/uL (ref 0.1–1.0)
Monocytes Relative: 8 %
NEUTROS PCT: 31 %
Neutro Abs: 2.6 10*3/uL (ref 1.7–7.7)
PLATELETS: 290 10*3/uL (ref 150–400)
RBC: 1.85 MIL/uL — AB (ref 3.87–5.11)
RDW: 28.7 % — AB (ref 11.5–15.5)
WBC: 8.4 10*3/uL (ref 4.0–10.5)
nRBC: 61 /100 WBC — ABNORMAL HIGH

## 2015-08-21 LAB — RETICULOCYTES: RBC.: 1.85 MIL/uL — AB (ref 3.87–5.11)

## 2015-08-21 LAB — LACTATE DEHYDROGENASE: LDH: 579 U/L — AB (ref 98–192)

## 2015-08-21 MED ORDER — SODIUM CHLORIDE 0.9 % IV SOLN
Freq: Once | INTRAVENOUS | Status: AC
  Administered 2015-08-21: 17:00:00 via INTRAVENOUS

## 2015-08-21 MED ORDER — SODIUM CHLORIDE 0.9 % IV SOLN: Freq: Once | INTRAVENOUS | Status: AC

## 2015-08-21 NOTE — Progress Notes (Signed)
PHARMACY BRIEF NOTE: HYDROXYUREA   By [facility] policy, hydroxyurea is automatically held when any of the following laboratory values occur:  ANC < 2 K  Pltc < 80K in sickle-cell patients; < 100K in other patients  Hgb <= 6 in sickle-cell patients; < 8 in other patients  Reticulocytes < 80K when Hgb < 9  Hydroxyurea has been held (discontinued from profile) per policy.   Hgb=5.9  Arley Phenixllen Connor Foxworthy RPh 08/21/2015, 7:34 AM Pager [phone]

## 2015-08-21 NOTE — Progress Notes (Signed)
SICKLE CELL SERVICE PROGRESS NOTE  Valerie Reid EAV:[id number] DOB: 1990-12-16 DOA: 08/19/2015 PCP: No PCP Per Patient  Assessment/Plan: Active Problems:   Hypoxia   Sickle cell crisis (HCC)   Hb-SS disease with crisis (HCC)   Anemia of chronic disease   Mild dehydration  1. Hb SS with crisis: Will continue oral Dilaudid and continue PCA for PRN use. Continue Toradol and IVF.   2. Migraine Headache: This seems resolved. 3. Leukocytosis: Likely related to crisis. No evidence of infection. Continue to monitor 4. Anemia of chronic Disease: Hemoglobin has dropped. I will given 1 unit of packed red blood cells especially in the setting of her hypoxemia.   Code Status: Full Code Family Communication: N/A Disposition Plan: Not yet ready for discharge  [facility] Center[facility]AR[facility]WAL  Pager [phone]. If 7PM-7AM, please contact night-coverage.  08/21/2015, 7:23 AM  LOS: 2 days   Interim History: Pt  Feels much better today. She has no headaches and pain is down to 2 out of 3. Hemoglobin however has dropped to 5.9g. No significant shortness of breath no cough  Consultants:  None  Procedures:  None  Antibiotics:  None   Objective: Filed Vitals:   08/20/15 2313 08/20/15 2343 08/21/15 0218 08/21/15 0400  BP:   87/47   Pulse:   81   Temp:   98 F (36.7 C)   TempSrc:   Oral   Resp: 12 12 14 15   Height:      Weight:   49.714 kg (109 lb 9.6 oz)   SpO2: 95% 95% 95% 98%   Weight change: -0.181 kg (-6.4 oz)  Intake/Output Summary (Last 24 hours) at 08/21/15 0723 Last data filed at 08/21/15 0215  Gross per 24 hour  Intake    715 ml  Output   1400 ml  Net   -685 ml    General: Alert, awake, oriented x3, in moderate distress secondary to pain and nausea.  HEENT: [place]/AT PEERL, EOMI, anicteric. Neck: Trachea midline,  no masses, no thyromegal,y no JVD, no carotid bruit OROPHARYNX:  Moist, No exudate/ erythema/lesions.  Heart: Regular rate and rhythm, without murmurs, rubs, gallops, PMI  non-displaced, no heaves or thrills on palpation.  Lungs: Clear to auscultation, no wheezing or rhonchi noted. No increased vocal fremitus resonant to percussion  Abdomen: Soft, nontender, nondistended, positive bowel sounds, no masses no hepatosplenomegaly noted..  Neuro: No focal neurological deficits noted cranial nerves II through XII grossly intact.  Strength at functional baseline in bilateral upper and lower extremities. Musculoskeletal: No warm swelling or erythema around joints, no spinal tenderness noted. Psychiatric: Patient alert and oriented x3, good insight and cognition, good recent to remote recall.    Data Reviewed: Basic Metabolic Panel:  Recent Labs Lab 08/19/15 0445  NA 139  K 3.9  CL 108  CO2 25  GLUCOSE 108*  BUN 6  CREATININE <0.30*  CALCIUM 9.0   Liver Function Tests:  Recent Labs Lab 08/19/15 0445  AST 75*  ALT 28  ALKPHOS 76  BILITOT 4.5*  PROT 7.8  ALBUMIN 4.4   No results for input(s): LIPASE, AMYLASE in the last 168 hours. No results for input(s): AMMONIA in the last 168 hours. CBC:  Recent Labs Lab 08/19/15 0445 08/20/15 1523 08/21/15 0618  WBC 15.6* 9.7 8.4  NEUTROABS 6.7 4.0 2.6  HGB 7.1* 6.3* 5.9*  HCT 19.7* 17.2* 16.4*  MCV 90.8 88.2 88.6  PLT 324 PLATELET CLUMPS NOTED ON SMEAR, COUNT APPEARS ADEQUATE 290   Cardiac Enzymes: No results  for input(s): CKTOTAL, CKMB, CKMBINDEX, TROPONINI in the last 168 hours. BNP (last 3 results) No results for input(s): BNP in the last 8760 hours.  ProBNP (last 3 results) No results for input(s): PROBNP in the last 8760 hours.  CBG: No results for input(s): GLUCAP in the last 168 hours.  No results found for this or any previous visit (from the past 240 hour(s)).   Studies: Dg Chest 2 View  08/19/2015  CLINICAL DATA:  Sickle sent pain/ crisis with worsening last 2 days EXAM: CHEST  2 VIEW COMPARISON:  03/27/2015 FINDINGS: Cardiomediastinal silhouette is stable. Again noted bilateral  upper lobe suprahilar streaky scarring and some architectural distortion. Stable streaky scarring right infrahilar region. No definite superimposed infiltrate or pulmonary edema. No pleural effusion or pneumothorax. Bony thorax is unremarkable. IMPRESSION: Again noted bilateral upper lobe suprahilar streaky scarring and some architectural distortion. Stable streaky scarring right infrahilar region. No definite superimposed infiltrate or pulmonary edema. Electronically Signed   By: Natasha Mead M.D.   On: 08/19/2015 07:58   Ct Angio Chest Pe W/cm &/or Wo Cm  08/19/2015  CLINICAL DATA:  Chronic sickle cell pain, elevated D-dimer EXAM: CT ANGIOGRAPHY CHEST WITH CONTRAST TECHNIQUE: Multidetector CT imaging of the chest was performed using the standard protocol during bolus administration of intravenous contrast. Multiplanar CT image reconstructions and MIPs were obtained to evaluate the vascular anatomy. CONTRAST:  100 cc Isovue COMPARISON:  None. FINDINGS: Images of the thoracic inlet are unremarkable. Central airways are patent. No mediastinal hematoma or adenopathy. Borderline cardiomegaly. No pulmonary embolus is noted. Precarinal lymph node measures 7 mm short-axis. Right hilar lymph node measures 9 mm short-axis. Left hilar lymph node measures 9 mm short-axis. These are borderline enlarged by size criteria. No pulmonary embolus is noted.  There is no aortic aneurysm. Images of the lung parenchyma shows architectural distortion bilateral upper lobes. There is linear interstitial thickening traction and cylindrical bronchiectasis in right upper lobe anteriorly. Pleural parenchymal scarring/ fibrotic changes are noted also in left apex. Similar fibrotic changes with linear interstitial thickening in right lower lobe posterior laterally please see axial image 44. There is no segmental infiltrate or pulmonary edema. Air trapping noted in right lower lobe medially. Findings are suspicious for fibrotic changes and  sequela from prior infection or infarction in this patient with known sickle cell disease and probable prior pulmonary infection. No evidence of interstitial nodularity to suggest sore choroid. Clinical correlation is necessary. There is no evidence of pulmonary edema. Sagittal images of the spine shows no destructive bony lesions. There are sickle cell sequela with H-shaped vertebrae T5 vertebral body T8 and T9 vertebral body. 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 Review of the MIP images confirms the above findings. IMPRESSION: 1. No pulmonary embolus is noted. 2. Borderline cardiomegaly.  Borderline bilateral hilar adenopathy. 3. There is architectural distortion and bandlike fibrotic changes bilateral upper lobe and right lower lobe. Some traction and cylindrical bronchiectasis noted in right upper lobe anteriorly. Pleural parenchymal fibrotic changes noted in left upper lobe/apex with some central bronchiectasis. Findings highly suspicious for fibrotic changes probable sequelae from prior pulmonary infections or lung infarct in this patient with known sickle cell disease. Less likely sarcoid. Clinical correlation is necessary. Air trapping noted in right lower lobe medially. 4. No segmental infiltrate or pulmonary edema. 5. There are sickle cell sequela with H-shaped vertebrae T5 vertebral body T8 and T9 vertebral body. 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 Electronically  Signed   By: Natasha Mead M.D.   On: 08/19/2015 11:28    Scheduled Meds: . sodium chloride   Intravenous Once  . enoxaparin (LOVENOX) injection  40 mg Subcutaneous Q24H  . HYDROmorphone   Intravenous Q4H  . HYDROmorphone  2 mg Oral Q6H  . hydroxyurea  1,000 mg Oral Daily  . ketorolac  30 mg Intravenous Q6H  . senna-docusate  1 tablet Oral BID   Continuous Infusions: . dextrose 5 % and 0.45% NaCl 75 mL/hr at 08/20/15 1906    Time spent 30 minutes and  greater than  50% involved face to face contact with the patient for assessment, counseling and coordination of care

## 2015-08-22 LAB — COMPREHENSIVE METABOLIC PANEL
ALBUMIN: 3.6 g/dL (ref 3.5–5.0)
ALT: 54 U/L (ref 14–54)
ANION GAP: 4 — AB (ref 5–15)
AST: 85 U/L — ABNORMAL HIGH (ref 15–41)
Alkaline Phosphatase: 60 U/L (ref 38–126)
BUN: <5 mg/dL — ABNORMAL LOW (ref 6–20)
CO2: 29 mmol/L (ref 22–32)
Calcium: 8.4 mg/dL — ABNORMAL LOW (ref 8.9–10.3)
Chloride: 106 mmol/L (ref 101–111)
GLUCOSE: 122 mg/dL — AB (ref 65–99)
Potassium: 3.7 mmol/L (ref 3.5–5.1)
SODIUM: 139 mmol/L (ref 135–145)
Total Bilirubin: 3.6 mg/dL — ABNORMAL HIGH (ref 0.3–1.2)
Total Protein: 6.5 g/dL (ref 6.5–8.1)

## 2015-08-22 LAB — CBC WITH DIFFERENTIAL/PLATELET
BASOS ABS: 0 10*3/uL (ref 0.0–0.1)
Basophils Relative: 0 %
EOS PCT: 3 %
Eosinophils Absolute: 0.2 10*3/uL (ref 0.0–0.7)
HCT: 21.8 % — ABNORMAL LOW (ref 36.0–46.0)
HEMOGLOBIN: 7.8 g/dL — AB (ref 12.0–15.0)
LYMPHS PCT: 76 %
Lymphs Abs: 5.2 10*3/uL — ABNORMAL HIGH (ref 0.7–4.0)
MCH: 31 pg (ref 26.0–34.0)
MCHC: 35.8 g/dL (ref 30.0–36.0)
MCV: 86.5 fL (ref 78.0–100.0)
MONOS PCT: 6 %
Monocytes Absolute: 0.4 10*3/uL (ref 0.1–1.0)
NEUTROS ABS: 1 10*3/uL — AB (ref 1.7–7.7)
NEUTROS PCT: 15 %
Platelets: 302 10*3/uL (ref 150–400)
RBC: 2.52 MIL/uL — AB (ref 3.87–5.11)
RDW: 24.3 % — ABNORMAL HIGH (ref 11.5–15.5)
WBC: 6.8 10*3/uL (ref 4.0–10.5)
nRBC: 90 /100 WBC — ABNORMAL HIGH

## 2015-08-22 NOTE — Progress Notes (Signed)
Patient reports that she cannot urinate.  Bladder scan reveals 393 mls.

## 2015-08-22 NOTE — Discharge Summary (Signed)
Physician Discharge Summary  Patient ID: Valerie MaduraYaneth Wilborn MRN: [id number] DOB/AGE: 08-08-1990 25 y.o.  Admit date: 08/19/2015 Discharge date: 08/22/2015  Admission Diagnoses:  Discharge Diagnoses:  Active Problems:   Hypoxia   Sickle cell crisis (HCC)   Hb-SS disease with crisis (HCC)   Anemia of chronic disease   Mild dehydration   Discharged Condition: good  Hospital Course: The patient with hemoglobin SS [facility] and presented to our hospital with complaints of pain in her bilateral rib cage as well as low back and legs. It was 8 out of 10 at the time of admission but goal is as high as 10 out of 10. Was associated with nausea vomiting. Patient was also found to be hypoxemic while admitted in the hospital. She is relatively opiate tolerant I was placed on Dilaudid PCA with Toradol and IV fluids. Baseline hemoglobin was at 7 g but daughter hospitalization it dropped to 5.9 g and she was transfused one unit of packed red blood cells. This brought a hemoglobin of about 7 and her pain was down to 2 out of 10 so she was discharged home to follow up with her primary care physician as well as her hematologist.  Consults: None  Significant Diagnostic Studies: labs: Serial CBCs and CMP as well as checked. She was type and crossmatch for blood transfusions.  Treatments: IV hydration, analgesia: Dilaudid and blood transfusion of packed red blood cells  Discharge Exam: Blood pressure 91/48, pulse 59, temperature 98.7 F (37.1 C), temperature source Oral, resp. rate 8, height 5\' 2"  (1.575 m), weight 49.805 kg (109 lb 12.8 oz), last menstrual period 08/08/2015, SpO2 99 %. General appearance: alert, cooperative and no distress Eyes: conjunctivae/corneas clear. PERRL, EOM's intact. Fundi benign. Resp: clear to auscultation bilaterally Chest wall: no tenderness Cardio: regular rate and rhythm, S1, S2 normal, no murmur, click, rub or  gallop GI: soft, non-tender; bowel sounds normal; no masses,  no organomegaly Extremities: extremities normal, atraumatic, no cyanosis or edema Pulses: 2+ and symmetric Skin: Skin color, texture, turgor normal. No rashes or lesions Neurologic: Grossly normal  Disposition: Patient discharged home to follow up with primary care physician     Medication List    STOP taking these medications        OXYCODONE ER PO      TAKE these medications        albuterol 108 (90 Base) MCG/ACT inhaler  Commonly known as:  PROVENTIL HFA;VENTOLIN HFA  Inhale 1-2 puffs into the lungs every 6 (six) hours as needed for wheezing or shortness of breath.     DILAUDID PO  Take 2 mg by mouth every 6 (six) hours as needed (pain).     HYDROcodone-acetaminophen 10-325 MG tablet  Commonly known as:  NORCO  Take 1 tablet by mouth every 6 (six) hours as needed for moderate pain.     hydroxyurea 500 MG capsule  Commonly known as:  HYDREA  Take 1,500 mg by mouth daily. May take with food to minimize GI side effects.         SignedLonia Blood: Montina Dorrance,LAWAL 08/22/2015, 7:11 AM  Time spent 35 minutes

## 2015-08-22 NOTE — Progress Notes (Signed)
Pt had the dry heaves and brought up about 20 ml clear liquid. StaTES IT'S BECAUSE SHE HASN'T EATEN. Boyfriend in to take pt home . Discharge instructions gone over with pt, no prescriptions given. Discharged via wheelchair

## 2015-08-23 LAB — TYPE AND SCREEN
ABO/RH(D): O POS
Antibody Screen: NEGATIVE
UNIT DIVISION: 0
Unit division: 0

## 2015-12-06 ENCOUNTER — Emergency Department (HOSPITAL_COMMUNITY)
Admission: EM | Admit: 2015-12-06 | Discharge: 2015-12-06 | Disposition: A | Discharge status: Home / Self Care | Payer: No Typology Code available for payment source | Attending: Emergency Medicine | Admitting: Emergency Medicine

## 2015-12-06 ENCOUNTER — Emergency Department (HOSPITAL_COMMUNITY): Payer: No Typology Code available for payment source

## 2015-12-06 ENCOUNTER — Encounter (HOSPITAL_COMMUNITY): Payer: Self-pay | Admitting: Emergency Medicine

## 2015-12-06 DIAGNOSIS — S161XXA Strain of muscle, fascia and tendon at neck level, initial encounter: Secondary | ICD-10-CM | POA: Diagnosis not present

## 2015-12-06 DIAGNOSIS — Y939 Activity, unspecified: Secondary | ICD-10-CM | POA: Insufficient documentation

## 2015-12-06 DIAGNOSIS — Y999 Unspecified external cause status: Secondary | ICD-10-CM | POA: Insufficient documentation

## 2015-12-06 DIAGNOSIS — J45909 Unspecified asthma, uncomplicated: Secondary | ICD-10-CM | POA: Insufficient documentation

## 2015-12-06 DIAGNOSIS — M545 Low back pain: Secondary | ICD-10-CM | POA: Insufficient documentation

## 2015-12-06 DIAGNOSIS — S199XXA Unspecified injury of neck, initial encounter: Secondary | ICD-10-CM | POA: Diagnosis present

## 2015-12-06 DIAGNOSIS — Y92410 Unspecified street and highway as the place of occurrence of the external cause: Secondary | ICD-10-CM | POA: Insufficient documentation

## 2015-12-06 LAB — CBC WITH DIFFERENTIAL/PLATELET
Band Neutrophils: 0 %
Basophils Absolute: 0.1 K/uL (ref 0.0–0.1)
Basophils Relative: 2 %
Blasts: 0 %
Eosinophils Absolute: 0.2 K/uL (ref 0.0–0.7)
Eosinophils Relative: 3 %
HCT: 22.1 % — ABNORMAL LOW (ref 36.0–46.0)
Hemoglobin: 8 g/dL — ABNORMAL LOW (ref 12.0–15.0)
Lymphocytes Relative: 53 %
Lymphs Abs: 2.9 K/uL (ref 0.7–4.0)
MCH: 39 pg — ABNORMAL HIGH (ref 26.0–34.0)
MCHC: 36.2 g/dL — ABNORMAL HIGH (ref 30.0–36.0)
MCV: 107.8 fL — ABNORMAL HIGH (ref 78.0–100.0)
Metamyelocytes Relative: 0 %
Monocytes Absolute: 0.4 K/uL (ref 0.1–1.0)
Monocytes Relative: 7 %
Myelocytes: 0 %
Neutro Abs: 2 K/uL (ref 1.7–7.7)
Neutrophils Relative %: 35 %
Other: 0 %
Platelets: 206 K/uL (ref 150–400)
Promyelocytes Absolute: 0 %
RBC: 2.05 MIL/uL — ABNORMAL LOW (ref 3.87–5.11)
RDW: 24.4 % — ABNORMAL HIGH (ref 11.5–15.5)
WBC: 5.6 K/uL (ref 4.0–10.5)
nRBC: 13 /100{WBCs} — ABNORMAL HIGH

## 2015-12-06 LAB — BASIC METABOLIC PANEL WITH GFR
Anion gap: 6 (ref 5–15)
BUN: 7 mg/dL (ref 6–20)
CO2: 24 mmol/L (ref 22–32)
Calcium: 8.9 mg/dL (ref 8.9–10.3)
Chloride: 109 mmol/L (ref 101–111)
Creatinine, Ser: <0.3 mg/dL — ABNORMAL LOW (ref 0.44–1.00)
Glucose, Bld: 90 mg/dL (ref 65–99)
Potassium: 3.7 mmol/L (ref 3.5–5.1)
Sodium: 139 mmol/L (ref 135–145)

## 2015-12-06 LAB — RETICULOCYTES
RBC.: 2.05 MIL/uL — ABNORMAL LOW (ref 3.87–5.11)
Retic Count, Absolute: 309.6 K/uL — ABNORMAL HIGH (ref 19.0–186.0)
Retic Ct Pct: 15.1 % — ABNORMAL HIGH (ref 0.4–3.1)

## 2015-12-06 MED ORDER — HYDROMORPHONE HCL 2 MG PO TABS
2.0000 mg | ORAL_TABLET | Freq: Once | ORAL | Status: AC
  Administered 2015-12-06: 2 mg via ORAL
  Filled 2015-12-06: qty 1
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
## 2015-12-06 NOTE — ED Notes (Signed)
I attempted to collect labs twice and was unsuccessful main lab is coming down to collect

## 2015-12-06 NOTE — ED Notes (Signed)
This RN attempted to draw blood unsuccessfully, Phlebotomy at bedside.
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
## 2015-12-06 NOTE — Discharge Instructions (Signed)
Apply ice to affected area. Increase mobility, stretch and massage muscles. Take home pain medications as needed for pain. Follow up with her primary care provider if symptoms do not improve. He will likely be much more sore tomorrow. Return to the ED if you experience severe worsening of her symptoms, loss control of bowels or bladder, numbness or tingling in your lower extremities. Blurry vision, vomiting.

## 2016-03-31 ENCOUNTER — Encounter (HOSPITAL_COMMUNITY): Payer: Self-pay | Admitting: Emergency Medicine

## 2016-03-31 ENCOUNTER — Emergency Department (HOSPITAL_COMMUNITY): Payer: Self-pay

## 2016-03-31 ENCOUNTER — Inpatient Hospital Stay (HOSPITAL_COMMUNITY)
Admission: EM | Admit: 2016-03-31 | Discharge: 2016-04-03 | DRG: 812 | Disposition: A | Discharge status: Home / Self Care | Payer: Self-pay | Attending: Internal Medicine | Admitting: Internal Medicine

## 2016-03-31 DIAGNOSIS — R112 Nausea with vomiting, unspecified: Secondary | ICD-10-CM | POA: Diagnosis present

## 2016-03-31 DIAGNOSIS — J45909 Unspecified asthma, uncomplicated: Secondary | ICD-10-CM | POA: Diagnosis present

## 2016-03-31 DIAGNOSIS — D5700 Hb-SS disease with crisis, unspecified: Principal | ICD-10-CM | POA: Diagnosis present

## 2016-03-31 DIAGNOSIS — T40605A Adverse effect of unspecified narcotics, initial encounter: Secondary | ICD-10-CM | POA: Diagnosis present

## 2016-03-31 DIAGNOSIS — J9811 Atelectasis: Secondary | ICD-10-CM | POA: Diagnosis present

## 2016-03-31 DIAGNOSIS — R9431 Abnormal electrocardiogram [ECG] [EKG]: Secondary | ICD-10-CM

## 2016-03-31 DIAGNOSIS — R0902 Hypoxemia: Secondary | ICD-10-CM | POA: Diagnosis present

## 2016-03-31 DIAGNOSIS — D638 Anemia in other chronic diseases classified elsewhere: Secondary | ICD-10-CM | POA: Diagnosis present

## 2016-03-31 DIAGNOSIS — I4581 Long QT syndrome: Secondary | ICD-10-CM | POA: Diagnosis present

## 2016-03-31 DIAGNOSIS — R339 Retention of urine, unspecified: Secondary | ICD-10-CM | POA: Diagnosis present

## 2016-03-31 DIAGNOSIS — Z3202 Encounter for pregnancy test, result negative: Secondary | ICD-10-CM | POA: Diagnosis not present

## 2016-03-31 DIAGNOSIS — Z832 Family history of diseases of the blood and blood-forming organs and certain disorders involving the immune mechanism: Secondary | ICD-10-CM

## 2016-03-31 LAB — COMPREHENSIVE METABOLIC PANEL
ALT: 19 U/L (ref 14–54)
AST: 51 U/L — ABNORMAL HIGH (ref 15–41)
Albumin: 4 g/dL (ref 3.5–5.0)
Alkaline Phosphatase: 65 U/L (ref 38–126)
Anion gap: 6 (ref 5–15)
BUN: 5 mg/dL — AB (ref 6–20)
CHLORIDE: 112 mmol/L — AB (ref 101–111)
CO2: 25 mmol/L (ref 22–32)
Calcium: 8.9 mg/dL (ref 8.9–10.3)
GLUCOSE: 94 mg/dL (ref 65–99)
Potassium: 3.5 mmol/L (ref 3.5–5.1)
SODIUM: 143 mmol/L (ref 135–145)
Total Bilirubin: 4.4 mg/dL — ABNORMAL HIGH (ref 0.3–1.2)
Total Protein: 6.6 g/dL (ref 6.5–8.1)

## 2016-03-31 LAB — CBC WITH DIFFERENTIAL/PLATELET
BAND NEUTROPHILS: 0 %
BASOS PCT: 0 %
Basophils Absolute: 0 10*3/uL (ref 0.0–0.1)
Blasts: 0 %
EOS ABS: 0.7 10*3/uL (ref 0.0–0.7)
EOS PCT: 5 %
HCT: 19.9 % — ABNORMAL LOW (ref 36.0–46.0)
Hemoglobin: 7.3 g/dL — ABNORMAL LOW (ref 12.0–15.0)
Lymphocytes Relative: 53 %
Lymphs Abs: 7 10*3/uL — ABNORMAL HIGH (ref 0.7–4.0)
MCH: 34.6 pg — AB (ref 26.0–34.0)
MCHC: 36.7 g/dL — ABNORMAL HIGH (ref 30.0–36.0)
MCV: 94.3 fL (ref 78.0–100.0)
METAMYELOCYTES PCT: 0 %
MONO ABS: 0.5 10*3/uL (ref 0.1–1.0)
MYELOCYTES: 0 %
Monocytes Relative: 4 %
NRBC: 13 /100{WBCs} — AB
Neutro Abs: 5 10*3/uL (ref 1.7–7.7)
Neutrophils Relative %: 38 %
Platelets: 347 10*3/uL (ref 150–400)
Promyelocytes Absolute: 0 %
RBC: 2.11 MIL/uL — ABNORMAL LOW (ref 3.87–5.11)
RDW: 27.2 % — ABNORMAL HIGH (ref 11.5–15.5)
WBC: 13.2 10*3/uL — ABNORMAL HIGH (ref 4.0–10.5)

## 2016-03-31 LAB — RESPIRATORY PANEL BY PCR
ADENOVIRUS-RVPPCR: NOT DETECTED
Bordetella pertussis: NOT DETECTED
CHLAMYDOPHILA PNEUMONIAE-RVPPCR: NOT DETECTED
CORONAVIRUS NL63-RVPPCR: NOT DETECTED
CORONAVIRUS OC43-RVPPCR: NOT DETECTED
Coronavirus 229E: NOT DETECTED
Coronavirus HKU1: NOT DETECTED
INFLUENZA B-RVPPCR: NOT DETECTED
Influenza A: NOT DETECTED
MYCOPLASMA PNEUMONIAE-RVPPCR: NOT DETECTED
Metapneumovirus: NOT DETECTED
PARAINFLUENZA VIRUS 1-RVPPCR: NOT DETECTED
Parainfluenza Virus 2: NOT DETECTED
Parainfluenza Virus 3: NOT DETECTED
Parainfluenza Virus 4: NOT DETECTED
Respiratory Syncytial Virus: NOT DETECTED
Rhinovirus / Enterovirus: NOT DETECTED

## 2016-03-31 LAB — URINALYSIS, ROUTINE W REFLEX MICROSCOPIC
BACTERIA UA: NONE SEEN
Bilirubin Urine: NEGATIVE
GLUCOSE, UA: NEGATIVE mg/dL
Ketones, ur: 5 mg/dL — AB
Leukocytes, UA: NEGATIVE
NITRITE: NEGATIVE
PROTEIN: NEGATIVE mg/dL
RBC / HPF: NONE SEEN RBC/hpf (ref 0–5)
SQUAMOUS EPITHELIAL / LPF: NONE SEEN
Specific Gravity, Urine: 1.01 (ref 1.005–1.030)
pH: 5 (ref 5.0–8.0)

## 2016-03-31 LAB — I-STAT TROPONIN, ED: Troponin i, poc: 0 ng/mL (ref 0.00–0.08)

## 2016-03-31 LAB — RETICULOCYTES: RBC.: 2.11 MIL/uL — ABNORMAL LOW (ref 3.87–5.11)

## 2016-03-31 MED ORDER — DIPHENHYDRAMINE HCL 50 MG/ML IJ SOLN: 12.5000 mg | Freq: Four times a day (QID) | INTRAMUSCULAR | Status: DC | PRN

## 2016-03-31 MED ORDER — CEFTRIAXONE SODIUM 1 G IJ SOLR
1.0000 g | Freq: Once | INTRAMUSCULAR | Status: AC
  Administered 2016-03-31: 1 g via INTRAVENOUS
  Filled 2016-03-31: qty 10

## 2016-03-31 MED ORDER — SODIUM CHLORIDE 0.9% FLUSH: 9.0000 mL | INTRAVENOUS | Status: DC | PRN

## 2016-03-31 MED ORDER — SODIUM CHLORIDE 0.9 % IV BOLUS (SEPSIS)
500.0000 mL | Freq: Once | INTRAVENOUS | Status: AC
  Administered 2016-03-31: 500 mL via INTRAVENOUS

## 2016-03-31 MED ORDER — NALOXONE HCL 0.4 MG/ML IJ SOLN: 0.4000 mg | INTRAMUSCULAR | Status: DC | PRN

## 2016-03-31 MED ORDER — KETOROLAC TROMETHAMINE 30 MG/ML IJ SOLN
30.0000 mg | Freq: Four times a day (QID) | INTRAMUSCULAR | Status: DC
  Administered 2016-03-31: 30 mg via INTRAVENOUS
  Filled 2016-03-31: qty 1

## 2016-03-31 MED ORDER — HYDROMORPHONE HCL 2 MG/ML IJ SOLN: 2.0000 mg | INTRAMUSCULAR | Status: DC

## 2016-03-31 MED ORDER — NALOXONE HCL 2 MG/2ML IJ SOSY
PREFILLED_SYRINGE | INTRAMUSCULAR | Status: AC
Start: 2016-03-31 — End: 2016-03-31
  Filled 2016-03-31: qty 2

## 2016-03-31 MED ORDER — NALOXONE HCL 0.4 MG/ML IJ SOLN
INTRAMUSCULAR | Status: AC
  Administered 2016-03-31: 0.4 mg via INTRAVENOUS
  Filled 2016-03-31: qty 1

## 2016-03-31 MED ORDER — HYDROMORPHONE HCL 2 MG/ML IJ SOLN: 0.5000 mg | INTRAMUSCULAR | Status: DC | PRN

## 2016-03-31 MED ORDER — POLYETHYLENE GLYCOL 3350 17 G PO PACK: 17.0000 g | PACK | Freq: Every day | ORAL | Status: DC | PRN

## 2016-03-31 MED ORDER — ACETAMINOPHEN 325 MG PO TABS
650.0000 mg | ORAL_TABLET | Freq: Once | ORAL | Status: AC
  Administered 2016-03-31: 650 mg via ORAL
  Filled 2016-03-31: qty 2

## 2016-03-31 MED ORDER — SENNOSIDES-DOCUSATE SODIUM 8.6-50 MG PO TABS
1.0000 | ORAL_TABLET | Freq: Two times a day (BID) | ORAL | Status: DC
  Administered 2016-03-31 – 2016-04-03 (×4): 1 via ORAL
  Filled 2016-03-31 (×5): qty 1

## 2016-03-31 MED ORDER — KETOROLAC TROMETHAMINE 30 MG/ML IJ SOLN
30.0000 mg | Freq: Four times a day (QID) | INTRAMUSCULAR | Status: DC
  Administered 2016-03-31 – 2016-04-03 (×11): 30 mg via INTRAVENOUS
  Filled 2016-03-31 (×11): qty 1

## 2016-03-31 MED ORDER — SODIUM CHLORIDE 0.9 % IV SOLN
Freq: Once | INTRAVENOUS | Status: AC
  Administered 2016-03-31: 08:00:00 via INTRAVENOUS

## 2016-03-31 MED ORDER — HYDROMORPHONE HCL 2 MG/ML IJ SOLN
2.0000 mg | INTRAMUSCULAR | Status: DC
  Filled 2016-03-31: qty 1

## 2016-03-31 MED ORDER — ONDANSETRON HCL 4 MG/2ML IJ SOLN
4.0000 mg | Freq: Four times a day (QID) | INTRAMUSCULAR | Status: DC | PRN
  Administered 2016-03-31 – 2016-04-01 (×2): 4 mg via INTRAVENOUS
  Filled 2016-03-31 (×2): qty 2

## 2016-03-31 MED ORDER — DIPHENHYDRAMINE HCL 12.5 MG/5ML PO ELIX: 12.5000 mg | ORAL_SOLUTION | Freq: Four times a day (QID) | ORAL | Status: DC | PRN

## 2016-03-31 MED ORDER — HYDROXYUREA 500 MG PO CAPS
1500.0000 mg | ORAL_CAPSULE | Freq: Every day | ORAL | Status: DC
  Administered 2016-04-01 – 2016-04-02 (×2): 1500 mg via ORAL
  Filled 2016-03-31 (×2): qty 3

## 2016-03-31 MED ORDER — ALBUTEROL SULFATE (2.5 MG/3ML) 0.083% IN NEBU
5.0000 mg | INHALATION_SOLUTION | Freq: Once | RESPIRATORY_TRACT | Status: AC
  Administered 2016-03-31: 5 mg via RESPIRATORY_TRACT
  Filled 2016-03-31: qty 6

## 2016-03-31 MED ORDER — HYDROMORPHONE HCL 2 MG/ML IJ SOLN
2.0000 mg | INTRAMUSCULAR | Status: DC
  Administered 2016-03-31: 2 mg via INTRAVENOUS
  Administered 2016-03-31: 1 mg via INTRAVENOUS
  Filled 2016-03-31: qty 1

## 2016-03-31 MED ORDER — HYDROMORPHONE HCL 1 MG/ML IJ SOLN
0.5000 mg | INTRAMUSCULAR | Status: DC | PRN
  Administered 2016-03-31 (×2): 0.5 mg via INTRAVENOUS
  Filled 2016-03-31 (×2): qty 1

## 2016-03-31 MED ORDER — DIPHENHYDRAMINE HCL 50 MG/ML IJ SOLN
25.0000 mg | Freq: Once | INTRAMUSCULAR | Status: AC
  Administered 2016-03-31: 25 mg via INTRAVENOUS
  Filled 2016-03-31: qty 1

## 2016-03-31 MED ORDER — HYDROMORPHONE 1 MG/ML IV SOLN
INTRAVENOUS | Status: DC
  Administered 2016-03-31: 18:00:00 via INTRAVENOUS
  Administered 2016-03-31: 0.3 mg via INTRAVENOUS
  Administered 2016-04-01: 0.9 mg via INTRAVENOUS
  Administered 2016-04-01: 0 mg via INTRAVENOUS
  Administered 2016-04-01: 1.2 mg via INTRAVENOUS
  Administered 2016-04-01 (×2): 0.3 mg via INTRAVENOUS
  Filled 2016-03-31: qty 25

## 2016-03-31 MED ORDER — ONDANSETRON HCL 4 MG/2ML IJ SOLN
4.0000 mg | Freq: Once | INTRAMUSCULAR | Status: AC | PRN
  Administered 2016-03-31: 4 mg via INTRAVENOUS
  Filled 2016-03-31: qty 2

## 2016-03-31 MED ORDER — HYDROMORPHONE HCL 2 MG PO TABS: 2.0000 mg | ORAL_TABLET | Freq: Once | ORAL | Status: DC

## 2016-03-31 MED ORDER — HYDROMORPHONE HCL 2 MG/ML IJ SOLN
2.0000 mg | INTRAMUSCULAR | Status: AC
  Administered 2016-03-31: 2 mg via INTRAVENOUS
  Filled 2016-03-31: qty 1

## 2016-03-31 MED ORDER — BUDESONIDE 0.25 MG/2ML IN SUSP
0.2500 mg | Freq: Two times a day (BID) | RESPIRATORY_TRACT | Status: DC
  Administered 2016-03-31 – 2016-04-03 (×6): 0.25 mg via RESPIRATORY_TRACT
  Filled 2016-03-31 (×7): qty 2

## 2016-03-31 MED ORDER — FLUTICASONE PROPIONATE HFA 110 MCG/ACT IN AERO: 2.0000 | INHALATION_SPRAY | Freq: Two times a day (BID) | RESPIRATORY_TRACT | Status: DC

## 2016-03-31 MED ORDER — HYDROMORPHONE HCL 2 MG/ML IJ SOLN: 2.0000 mg | INTRAMUSCULAR | Status: AC

## 2016-03-31 MED ORDER — AZITHROMYCIN 500 MG IV SOLR
500.0000 mg | Freq: Once | INTRAVENOUS | Status: AC
  Administered 2016-03-31: 500 mg via INTRAVENOUS
  Filled 2016-03-31: qty 500

## 2016-03-31 MED ORDER — NALOXONE HCL 0.4 MG/ML IJ SOLN
0.4000 mg | Freq: Once | INTRAMUSCULAR | Status: AC
  Administered 2016-03-31: 0.4 mg via INTRAVENOUS

## 2016-03-31 MED ORDER — ENOXAPARIN SODIUM 40 MG/0.4ML ~~LOC~~ SOLN
40.0000 mg | SUBCUTANEOUS | Status: DC
  Administered 2016-03-31 – 2016-04-02 (×3): 40 mg via SUBCUTANEOUS
  Filled 2016-03-31 (×3): qty 0.4

## 2016-03-31 NOTE — H&P (Signed)
Hospital Admission Note Date: 03/31/2016  Patient name: Valerie Reid Medical record number: [id number] Date of birth: 04-06-1990 Age: 26 y.o. Gender: female PCP: No PCP Per Patient  Attending physician: Altha HarmMichelle A Aljean Horiuchi, MD  Chief Complaint:Pain in back and legs x 1 day  History of Present Illness:This is an opiate naive patient with Hb SS who presented to the ED after she had pin in back and legs typical of her Sickle Cell Crisis. Pt did not have and pain medications at home as she had received her prescription on 4 days ago but did not fill it as she did not have the money to do so. She tried to treat her pain with Ibuprofen but was unsuccessful thus she presented to the ED. Currently she rates her pain as 10/10. She denies any fevers, chills, vomiting or diarrhea.  In the ED she received 3 doses of Dilaudid 2 mg and became narcotized requiring Narcan. She also received a dose of Toradol 30 mg and IVF. I am asked to admit the patient for ongoing Sickle cell Crisis.    Scheduled Meds: .  HYDROmorphone (DILAUDID) injection  2 mg Intravenous Q1H   Or  .  HYDROmorphone (DILAUDID) injection  2 mg Subcutaneous Q1H  . ketorolac  30 mg Intravenous Q6H  . naloxone       Continuous Infusions: PRN Meds:. Allergies: Patient has no known allergies. Past Medical History:  Diagnosis Date  . Asthma   . Sickle cell anemia (HCC)    Past Surgical History:  Procedure Laterality Date  . CHOLECYSTECTOMY    . INDUCED ABORTION N/A   . port-a-cath insertion     . PORT-A-CATH REMOVAL     Family History  Problem Relation Age of Onset  . Sickle cell anemia Sister   . Sickle cell trait Mother   . Sickle cell trait Father    Social History   Social History  . Marital status: Single    Spouse name: N/A  . Number of children: N/A  . Years of education: N/A   Occupational History  . Not on file.   Social History Main Topics  . Smoking status: Never Smoker  . Smokeless tobacco: Never  Used  . Alcohol use No  . Drug use: No  . Sexual activity: Yes   Other Topics Concern  . Not on file   Social History Narrative  . No narrative on file   Review of Systems: Pertinent items noted in HPI and remainder of comprehensive ROS otherwise negative. Physical Exam:  Intake/Output Summary (Last 24 hours) at 03/31/16 1206 Last data filed at 03/31/16 1142  Gross per 24 hour  Intake              300 ml  Output                0 ml  Net              300 ml   General: Alert, awake, oriented x3, in no acute distress.  HEENT: [place]/AT PEERL, EOMI Neck: Trachea midline,  no masses, no thyromegal,y no JVD, no carotid bruit OROPHARYNX:  Moist, No exudate/ erythema/lesions.  Heart: Regular rate and rhythm, without murmurs, rubs, gallops, PMI non-displaced, no heaves or thrills on palpation.  Lungs: Clear to auscultation, no wheezing or rhonchi noted. No increased vocal fremitus resonant to percussion  Abdomen: Soft, nontender, nondistended, positive bowel sounds, no masses no hepatosplenomegaly noted..  Neuro: No focal neurological deficits noted cranial nerves II  through XII grossly intact. DTRs 2+ bilaterally upper and lower extremities. Strength 5 out of 5 in bilateral upper and lower extremities. Musculoskeletal: No warm swelling or erythema around joints, no spinal tenderness noted. Psychiatric: Patient alert and oriented x3, good insight and cognition, good recent to remote recall. Lymph node survey: No cervical axillary or inguinal lymphadenopathy noted.  Lab results:  Recent Labs  03/31/16 0626  NA 143  K 3.5  CL 112*  CO2 25  GLUCOSE 94  BUN 5*  CREATININE <0.30*  CALCIUM 8.9    Recent Labs  03/31/16 0626  AST 51*  ALT 19  ALKPHOS 65  BILITOT 4.4*  PROT 6.6  ALBUMIN 4.0   No results for input(s): LIPASE, AMYLASE in the last 72 hours.  Recent Labs  03/31/16 0626  WBC 13.2*  NEUTROABS 5.0  HGB 7.3*  HCT 19.9*  MCV 94.3  PLT 347   No results for  input(s): CKTOTAL, CKMB, CKMBINDEX, TROPONINI in the last 72 hours. Invalid input(s): POCBNP No results for input(s): DDIMER in the last 72 hours. No results for input(s): HGBA1C in the last 72 hours. No results for input(s): CHOL, HDL, LDLCALC, TRIG, CHOLHDL, LDLDIRECT in the last 72 hours. No results for input(s): TSH, T4TOTAL, T3FREE, THYROIDAB in the last 72 hours.  Invalid input(s): FREET3  Recent Labs  03/31/16 0626  RETICCTPCT >23.0*   Imaging results:  Dg Chest 2 View  Result Date: 03/31/2016 CLINICAL DATA:  Initial evaluation for acute sickle cell pain crisis. EXAM: CHEST  2 VIEW COMPARISON:  Prior radiograph from 08/19/2015. FINDINGS: Mild cardiomegaly, stable. Mediastinal silhouette within normal limits. Lungs normally inflated. Bilateral perihilar and upper lobe streaky perihilar opacities with architectural distortion, with additional scarring/architectural distortion within the right lung base. Changes are similar to previous exam. No definite superimposed focal airspace disease. No pulmonary edema or pleural effusion. No pneumothorax. No acute osseous abnormality. IMPRESSION: 1. Similar appearance of bilateral suprahilar and right infrahilar streaky airspace opacities with architectural distortion. No definite superimposed focal infiltrates identified. 2. Mild cardiomegaly without pulmonary edema, stable. Electronically Signed   By: Rise Mu M.D.   On: 03/31/2016 05:24   Other results: EKG: prolonged QT interval.  Assessment and Plan: 1. Hb SS with crisis:Will place patient on full dose Dilaudid PCA, Toradol and IVF. Will re-assess pain tomorrow for weaning of medications.   2. Anemia of Chronic Disease: Hb stable.  3. Prolonged QTc: Etiology unclear. Will re-check tomorrow.  4. DVT Prophylaxis: Lovenox.  Time spent during this visit was 60 minutes. Grater than 50% time spent in face to face contact for assessment, consultation and coordination of care.    Menashe Kafer A. 03/31/2016, 12:06 PM

## 2016-03-31 NOTE — ED Provider Notes (Signed)
[facility]-EMERGENCY DEPT Provider Note   CSN: [id number] Arrival date & time: 03/31/16  0402     History   Chief Complaint Chief Complaint  Patient presents with  . Sickle Cell Pain Crisis    HPI Valerie Reid is a [age] y.o. female with history of sickle cell anemia and asthma who presents with a one-week history of generalized pain and worsening shortness of breath. Patient states she has pain in her low back, chest, arms, and knees. Patient reports her pain is in the same place. Patient takes Dilaudid 2 mg tablets at home, but has been out of her pain medications for 2 weeks. [facility]. Patient works at [employer] and has been exposed to cold air. She believes this may have flared her crisis. Patient denies any fevers, abdominal pain, nausea, vomiting, urinary symptoms. Of note, patient does note yellowing of her eyes over the past week. She reports this has happened in the past with her crises.  HPI  Past Medical History:  Diagnosis Date  . Asthma   . Sickle cell anemia [facility](HCC)     Patient Active Problem List   Diagnosis Date Noted  . Hypoxia 08/19/2015  . Sickle cell crisis (HCC) 08/19/2015  . Hb-SS disease with crisis (HCC) 08/19/2015  . Anemia of chronic disease 08/19/2015  . Mild dehydration 08/19/2015  . Airway hyperreactivity 03/27/2015  . FO (foramen ovale) 01/01/2013  . 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 CHOLECYSTECTOMY    . INDUCED ABORTION N/A   . port-a-cath insertion     . PORT-A-CATH REMOVAL      OB History    No data available       Home Medications    Prior to Admission medications   Medication Sig Start Date End Date Taking? Authorizing Provider  fluticasone (FLOVENT HFA) 110 MCG/ACT inhaler Inhale 2 puffs into the lungs 2 (two) times daily.   Yes Historical Provider, MD  HYDROmorphone (DILAUDID) 2 MG  tablet Take 2 mg by mouth daily as needed for severe pain.   Yes Historical Provider, MD  hydroxyurea (HYDREA) 500 MG capsule Take 1,500 mg by mouth daily. May take with food to minimize GI side effects.   Yes Historical Provider, MD  Prenatal Vit-Fe Fumarate-FA (PRENATAL MULTIVITAMIN) TABS tablet Take 1 tablet by mouth daily at 12 noon.    Historical Provider, MD    Family History Family History  Problem Relation Age of Onset  . Sickle cell anemia Sister   . Sickle cell trait Mother   . Sickle cell trait Father     Social History Social History  Substance Use Topics  . Smoking status: Never Smoker  . Smokeless tobacco: Not on file  . Alcohol use No     Allergies   Patient has no known allergies.   Review of Systems Review of Systems  Constitutional: Negative for chills and fever.  HENT: Negative for facial swelling and sore throat.   Respiratory: Positive for shortness of breath. Negative for cough.   Cardiovascular: Negative for chest pain.  Gastrointestinal: Negative for abdominal pain, nausea and vomiting.  Genitourinary: Negative for dysuria.  Musculoskeletal: Positive for arthralgias, back pain and myalgias.  Skin: Negative for rash and wound.  Neurological: Positive for headaches (intermittent for the past week, none now).  Psychiatric/Behavioral: The patient is not nervous/anxious.      Physical  Exam Updated Vital Signs BP (!) 102/51 (BP Location: Left Arm)   Pulse 80   Temp 98 F (36.7 C) (Oral)   Resp 19   LMP 03/27/2016   SpO2 (!) 88%   Physical Exam  Constitutional: She appears well-developed and well-nourished. No distress.  HENT:  Head: Normocephalic and atraumatic.  Mouth/Throat: Oropharynx is clear and moist. No oropharyngeal exudate.  Chronically large tonsils, no erythema or exudate  Eyes: Conjunctivae are normal. Pupils are equal, round, and reactive to light. Right eye exhibits no discharge. Left eye exhibits no discharge. No scleral  icterus.  Neck: Normal range of motion. Neck supple. No thyromegaly present.  Cardiovascular: Normal rate, regular rhythm, normal heart sounds and intact distal pulses.  Exam reveals no gallop and no friction rub.   No murmur heard. Pulmonary/Chest: Effort normal. No stridor. No respiratory distress. She has wheezes (inspiratory). She has no rales.  Abdominal: Soft. Bowel sounds are normal. She exhibits no distension. There is no tenderness. There is no rebound and no guarding.  Musculoskeletal: She exhibits no edema.       Back:       Legs: Lymphadenopathy:    She has no cervical adenopathy.  Neurological: She is alert. Coordination normal.  Skin: Skin is warm and dry. No rash noted. She is not diaphoretic. No pallor.  Psychiatric: She has a normal mood and affect.  Nursing note and vitals reviewed.    ED Treatments / Results  Labs (all labs ordered are listed, but only abnormal results are displayed) Labs Reviewed  COMPREHENSIVE METABOLIC PANEL  CBC WITH DIFFERENTIAL/PLATELET  RETICULOCYTES  I-STAT TROPOININ, ED    EKG  EKG Interpretation None       Radiology No results found.  Procedures Procedures (including critical care time)  Medications Ordered in ED Medications  sodium chloride 0.9 % bolus 500 mL (not administered)  0.9 %  sodium chloride infusion (not administered)  HYDROmorphone (DILAUDID) injection 2 mg (not administered)    Or  HYDROmorphone (DILAUDID) injection 2 mg (not administered)  HYDROmorphone (DILAUDID) injection 2 mg (not administered)    Or  HYDROmorphone (DILAUDID) injection 2 mg (not administered)  HYDROmorphone (DILAUDID) injection 2 mg (not administered)    Or  HYDROmorphone (DILAUDID) injection 2 mg (not administered)  HYDROmorphone (DILAUDID) injection 2 mg (not administered)    Or  HYDROmorphone (DILAUDID) injection 2 mg (not administered)  diphenhydrAMINE (BENADRYL) injection 25 mg (not administered)     Initial Impression /  Assessment and Plan / ED Course  I have reviewed the triage vital signs and the nursing notes.  Pertinent labs & imaging results that were available during my care of the patient were reviewed by me and considered in my medical decision making (see chart for details).     Patient with sickle cell crisis. CXR shows 1. Similar appearance of bilateral suprahilar and right infrahilar streaky airspace opacities with architectural distortion. No definite superimposed focal infiltrates identified. 2. Mild cardiomegaly without pulmonary edema, stable. Labs pending. Difficult IV access. At shift change, patient care transferred to Fayrene Helper for continued evaluation, follow up of labs and determination of disposition. Anticipate admission for possible acute chest and new O2 requirement.    Final Clinical Impressions(s) / ED Diagnoses   Final diagnoses:  None    New Prescriptions New Prescriptions   No medications on file     Verdis Prime 03/31/16 4098    April Palumbo, MD 03/31/16 [phone]

## 2016-03-31 NOTE — ED Notes (Signed)
Pt given meal tray.

## 2016-03-31 NOTE — ED Provider Notes (Signed)
Received patient sign out at the beginning of shift. Patient with history of sickle cell has had persistent body aches and back pain going 1 week not adequately managed  with her home pain medication. she was found to be hypoxic when she first arrived with an O2 of 88% improved with supplemental oxygenation. She  denies any persistent cough however her chest x-ray is questionable for potential pneumonia. No recent hospitalization. She does have a white count of 13.2. She has received a total of 2 doses of pain medication (a total of 3mg  dilaudid) while in the ER but her pain has not been adequately controlled. She will need to be admitted for further management. Potential for acute chest, therefore patient will be placed on antibiotic.  9:56 AM I have consulted with Dr. Ashley RoyaltyMatthews, who agrees to admit pt.  She also point out that pt may be narcotic naive, therefore we must be judicious with pain medication.  Dr. Molli HazardMatthew was correct, pt is drowsy from her pain medication.  A dose of narcan 0.4mg  given.  Pt immediately became responsive.  She denies taking any antipyretic prior to coming in.  She report she has taking 2 separate dose of dilaudid at home. Suspect narcotic naive as suggested by Dr. Ashley RoyaltyMatthews.  Pt will be admitted to med surg for further care.  Hold off on narcotic pain meds at this time.  Nurse is aware.    BP 110/63 (BP Location: Right Arm)   Pulse 100   Temp 98 F (36.7 C) (Oral)   Resp 15   Wt 48.1 kg   LMP 03/27/2016   SpO2 97%   BMI 18.78 kg/m   Results for orders placed or performed during the hospital encounter of 03/31/16  Comprehensive metabolic panel  Result Value Ref Range   Sodium 143 135 - 145 mmol/L   Potassium 3.5 3.5 - 5.1 mmol/L   Chloride 112 (H) 101 - 111 mmol/L   CO2 25 22 - 32 mmol/L   Glucose, Bld 94 65 - 99 mg/dL   BUN 5 (L) 6 - 20 mg/dL   Creatinine, Ser <6.96<0.30 (L) 0.44 - 1.00 mg/dL   Calcium 8.9 8.9 - 29.510.3 mg/dL   Total Protein 6.6 6.5 - 8.1 g/dL   Albumin 4.0 3.5 - 5.0 g/dL   AST 51 (H) 15 - 41 U/L   ALT 19 14 - 54 U/L   Alkaline Phosphatase 65 38 - 126 U/L   Total Bilirubin 4.4 (H) 0.3 - 1.2 mg/dL   GFR calc non Af Amer NOT CALCULATED >60 mL/min   GFR calc Af Amer NOT CALCULATED >60 mL/min   Anion gap 6 5 - 15  CBC with Differential  Result Value Ref Range   WBC 13.2 (H) 4.0 - 10.5 K/uL   RBC 2.11 (L) 3.87 - 5.11 MIL/uL   Hemoglobin 7.3 (L) 12.0 - 15.0 g/dL   HCT 28.419.9 (L) 13.236.0 - 44.046.0 %   MCV 94.3 78.0 - 100.0 fL   MCH 34.6 (H) 26.0 - 34.0 pg   MCHC 36.7 (H) 30.0 - 36.0 g/dL   RDW 10.227.2 (H) 72.511.5 - 36.615.5 %   Platelets 347 150 - 400 K/uL   Neutrophils Relative % 38 %   Lymphocytes Relative 53 %   Monocytes Relative 4 %   Eosinophils Relative 5 %   Basophils Relative 0 %   Band Neutrophils 0 %   Metamyelocytes Relative 0 %   Myelocytes 0 %   Promyelocytes Absolute 0 %  Blasts 0 %   nRBC 13 (H) 0 /100 WBC   Neutro Abs 5.0 1.7 - 7.7 K/uL   Lymphs Abs 7.0 (H) 0.7 - 4.0 K/uL   Monocytes Absolute 0.5 0.1 - 1.0 K/uL   Eosinophils Absolute 0.7 0.0 - 0.7 K/uL   Basophils Absolute 0.0 0.0 - 0.1 K/uL   RBC Morphology POLYCHROMASIA PRESENT    Smear Review LARGE PLATELETS PRESENT   Reticulocytes  Result Value Ref Range   Retic Ct Pct >23.0 (H) 0.4 - 3.1 %   RBC. 2.11 (L) 3.87 - 5.11 MIL/uL   Retic Count, Manual NOT CALCULATED 19.0 - 186.0 K/uL  I-stat troponin, ED  Result Value Ref Range   Troponin i, poc 0.00 0.00 - 0.08 ng/mL   Comment 3           Dg Chest 2 View  Result Date: 03/31/2016 CLINICAL DATA:  Initial evaluation for acute sickle cell pain crisis. EXAM: CHEST  2 VIEW COMPARISON:  Prior radiograph from 08/19/2015. FINDINGS: Mild cardiomegaly, stable. Mediastinal silhouette within normal limits. Lungs normally inflated. Bilateral perihilar and upper lobe streaky perihilar opacities with architectural distortion, with additional scarring/architectural distortion within the right lung base. Changes are similar to  previous exam. No definite superimposed focal airspace disease. No pulmonary edema or pleural effusion. No pneumothorax. No acute osseous abnormality. IMPRESSION: 1. Similar appearance of bilateral suprahilar and right infrahilar streaky airspace opacities with architectural distortion. No definite superimposed focal infiltrates identified. 2. Mild cardiomegaly without pulmonary edema, stable. Electronically Signed   By: Rise Mu M.D.   On: 03/31/2016 05:24      Fayrene Helper, PA-C 03/31/16 1006    Gwyneth Sprout, MD 03/31/16 1905

## 2016-03-31 NOTE — Progress Notes (Signed)
Patient has the urge to void but unable to void at this time. Bladder scan reveals greater than 200cc. Dr. Ashley RoyaltyMatthews made aware. Order given to place foley catheter.

## 2016-03-31 NOTE — Progress Notes (Signed)
Pt informed ED CM she is seen by [facility] Joly[facility]Farland [facility]  EPIC updated

## 2016-03-31 NOTE — ED Triage Notes (Signed)
Patient c/o generalized sickle cel pain x week. Patient reports being out of her home pain meds. Patient also reports feeling SOB.

## 2016-04-01 DIAGNOSIS — R0902 Hypoxemia: Secondary | ICD-10-CM

## 2016-04-01 DIAGNOSIS — R338 Other retention of urine: Secondary | ICD-10-CM

## 2016-04-01 LAB — PREGNANCY, URINE: Preg Test, Ur: NEGATIVE

## 2016-04-01 MED ORDER — HYDROMORPHONE HCL 2 MG PO TABS
2.0000 mg | ORAL_TABLET | ORAL | Status: DC | PRN
  Filled 2016-04-01: qty 1

## 2016-04-01 MED ORDER — HYDROMORPHONE HCL 2 MG PO TABS: 2.0000 mg | ORAL_TABLET | ORAL | Status: DC | PRN

## 2016-04-01 MED ORDER — PROCHLORPERAZINE EDISYLATE 5 MG/ML IJ SOLN
10.0000 mg | Freq: Four times a day (QID) | INTRAMUSCULAR | Status: DC | PRN
  Administered 2016-04-02 (×2): 10 mg via INTRAVENOUS
  Filled 2016-04-01 (×2): qty 2

## 2016-04-01 MED ORDER — HYDROMORPHONE HCL 2 MG/ML IJ SOLN
0.5000 mg | INTRAMUSCULAR | Status: DC | PRN
  Administered 2016-04-01 – 2016-04-02 (×3): 0.5 mg via INTRAVENOUS
  Filled 2016-04-01 (×3): qty 1

## 2016-04-01 NOTE — Progress Notes (Signed)
SICKLE CELL SERVICE PROGRESS NOTE  Valerie Reid ZOX:[id number] DOB: 1990-04-11 DOA: 03/31/2016 PCP: Ledon Snare, MD  Assessment/Plan: Active Problems:   Sickle cell crisis (HCC)   Sickle cell pain crisis (HCC)  1. Hb SS with crisis: Pt states pain improved and has used very little on the PCA. I will schedule her oral Dilaudid and discontinue the PCA. Continue Toradol Decrease IVF to [facility]. Pt wants to be discharged home today. From the standpoint of her pain she can likely be discharged however she has hypoxia which is unexplained.  2. Leukocytosis: Pt has a mild leukocytosis without evidence of infection. Likely related to the crisis.  3. Prolonged QTc: repeat EKG shows an improvement in QT interval. 4. Acute Urinary retention: Pt developed urinary retention likely related to Opiate use. Will remove foley and give a trial of voiding. If patient able to void can be discharged home.  5. Nausea and Emesis: Check Urine Pregnancy test. 6. Hypoxia: Pt having hypoxia on RA with ambulation. No clinical evidence of infection. Etiology unclear. Encourage incentive spirometer use.    Code Status: Full Code Family Communication: N/A Disposition Plan: Not yet ready for discharge  Shanzay Hepworth A.  Pager [phone]. If 7PM-7AM, please contact night-coverage.  04/01/2016, 5:40 PM  LOS: 1 day  Interim History: Pt reports that she has no pain at present. She has used only 1.25 mg of dilaudid on the PCA since admission yesterday. She had an episode of emesis last evening but denies nausea at present.   Consultants:  None  Procedures:  None  Antibiotics:  None   Objective: Vitals:   04/01/16 0937 04/01/16 1143 04/01/16 1340 04/01/16 1555  BP:   (!) 105/57   Pulse:   63   Resp:  15 16 17   Temp:   98.1 F (36.7 C)   TempSrc:   Oral   SpO2: 97% 99% 100% 99%  Weight:      Height:       Weight change:   Intake/Output Summary (Last 24 hours) at 04/01/16 1740 Last data filed  at 04/01/16 1652  Gross per 24 hour  Intake           351.66 ml  Output             1600 ml  Net         -1248.34 ml    General: Alert, awake, oriented x3, in no acute distress.  HEENT: [place]/AT PEERL, EOMI, anicteric. Neck: Trachea midline,  no masses, no thyromegal,y no JVD, no carotid bruit OROPHARYNX:  Moist, No exudate/ erythema/lesions.  Heart: Regular rate and rhythm, without murmurs, rubs, gallops, PMI non-displaced, no heaves or thrills on palpation.  Lungs: Clear to auscultation, no wheezing or rhonchi noted. No increased vocal fremitus resonant to percussion  Abdomen: Soft, nontender, nondistended, positive bowel sounds, no masses no hepatosplenomegaly noted.  Neuro: No focal neurological deficits noted cranial nerves II through XII grossly intact. Strength at baseline in bilateral upper and lower extremities. Musculoskeletal: No warmth swelling or erythema around joints, no spinal tenderness noted. Psychiatric: Patient alert and oriented x3, good insight and cognition, good recent to remote recall.    Data Reviewed: Basic Metabolic Panel:  Recent Labs Lab 03/31/16 0626  NA 143  K 3.5  CL 112*  CO2 25  GLUCOSE 94  BUN 5*  CREATININE <0.30*  CALCIUM 8.9   Liver Function Tests:  Recent Labs Lab 03/31/16 0626  AST 51*  ALT 19  ALKPHOS 65  BILITOT 4.4*  PROT 6.6  ALBUMIN 4.0   No results for input(s): LIPASE, AMYLASE in the last 168 hours. No results for input(s): AMMONIA in the last 168 hours. CBC:  Recent Labs Lab 03/31/16 0626  WBC 13.2*  NEUTROABS 5.0  HGB 7.3*  HCT 19.9*  MCV 94.3  PLT 347   Cardiac Enzymes: No results for input(s): CKTOTAL, CKMB, CKMBINDEX, TROPONINI in the last 168 hours. BNP (last 3 results) No results for input(s): BNP in the last 8760 hours.  ProBNP (last 3 results) No results for input(s): PROBNP in the last 8760 hours.  CBG: No results for input(s): GLUCAP in the last 168 hours.  Recent Results (from the past 240  hour(s))  Respiratory Panel by PCR     Status: None   Collection Time: 03/31/16 10:28 AM  Result Value Ref Range Status   Adenovirus NOT DETECTED NOT DETECTED Final   Coronavirus 229E NOT DETECTED NOT DETECTED Final   Coronavirus HKU1 NOT DETECTED NOT DETECTED Final   Coronavirus NL63 NOT DETECTED NOT DETECTED Final   Coronavirus OC43 NOT DETECTED NOT DETECTED Final   Metapneumovirus NOT DETECTED NOT DETECTED Final   Rhinovirus / Enterovirus NOT DETECTED NOT DETECTED Final   Influenza A NOT DETECTED NOT DETECTED Final   Influenza B NOT DETECTED NOT DETECTED Final   Parainfluenza Virus 1 NOT DETECTED NOT DETECTED Final   Parainfluenza Virus 2 NOT DETECTED NOT DETECTED Final   Parainfluenza Virus 3 NOT DETECTED NOT DETECTED Final   Parainfluenza Virus 4 NOT DETECTED NOT DETECTED Final   Respiratory Syncytial Virus NOT DETECTED NOT DETECTED Final   Bordetella pertussis NOT DETECTED NOT DETECTED Final   Chlamydophila pneumoniae NOT DETECTED NOT DETECTED Final   Mycoplasma pneumoniae NOT DETECTED NOT DETECTED Final    Comment: Performed at [facility], [address] [place], [place] [address]     Studies: Dg Chest 2 View  Result Date: 03/31/2016 CLINICAL DATA:  Initial evaluation for acute sickle cell pain crisis. EXAM: CHEST  2 VIEW COMPARISON:  Prior radiograph from 08/19/2015. FINDINGS: Mild cardiomegaly, stable. Mediastinal silhouette within normal limits. Lungs normally inflated. Bilateral perihilar and upper lobe streaky perihilar opacities with architectural distortion, with additional scarring/architectural distortion within the right lung base. Changes are similar to previous exam. No definite superimposed focal airspace disease. No pulmonary edema or pleural effusion. No pneumothorax. No acute osseous abnormality. IMPRESSION: 1. Similar appearance of bilateral suprahilar and right infrahilar streaky airspace opacities with architectural distortion. No definite superimposed  focal infiltrates identified. 2. Mild cardiomegaly without pulmonary edema, stable. Electronically Signed   By: Rise MuBenjamin  McClintock M.D.   On: 03/31/2016 05:24    Scheduled Meds: . budesonide (PULMICORT) nebulizer solution  0.25 mg Nebulization BID  . enoxaparin (LOVENOX) injection  40 mg Subcutaneous Q24H  . HYDROmorphone   Intravenous Q4H  . HYDROmorphone  2 mg Oral Once  . hydroxyurea  1,500 mg Oral Daily  . ketorolac  30 mg Intravenous Q6H  . senna-docusate  1 tablet Oral BID   Continuous Infusions:  Active Problems:   Sickle cell crisis (HCC)   Sickle cell pain crisis (HCC)     In excess of 25 minutes spent during this visit. Greater than 50% involved face to face contact with the patient for assessment, counseling and coordination of care.

## 2016-04-01 NOTE — Progress Notes (Signed)
Oxygen level on ambulation went down to 86% RA. Incentive spirometer encouraged.

## 2016-04-01 NOTE — Progress Notes (Signed)
Foley catheter discontinued as ordered,patient tolerated the procedure,emptied 300cc from foley bag. Patient vomited x 1, approximately 200 cc,yellowish in color. Zofran 4 mg IV administered. Will continue to  monitor.

## 2016-04-01 NOTE — Progress Notes (Signed)
SATURATION QUALIFICATIONS: (This note is used to comply with regulatory documentation for home oxygen)  Patient Saturations on Room Air at Rest = 97%  Patient Saturations on Room Air while Ambulating = 92%  Patient Saturations on 0Liters of oxygen while Ambulating = 0%  Please briefly explain why patient needs home oxygen: 

## 2016-04-02 LAB — BASIC METABOLIC PANEL
ANION GAP: 6 (ref 5–15)
BUN: <5 mg/dL — ABNORMAL LOW (ref 6–20)
CHLORIDE: 108 mmol/L (ref 101–111)
CO2: 26 mmol/L (ref 22–32)
Calcium: 8.3 mg/dL — ABNORMAL LOW (ref 8.9–10.3)
Creatinine, Ser: <0.3 mg/dL — ABNORMAL LOW (ref 0.44–1.00)
Glucose, Bld: 104 mg/dL — ABNORMAL HIGH (ref 65–99)
POTASSIUM: 3.6 mmol/L (ref 3.5–5.1)
SODIUM: 140 mmol/L (ref 135–145)

## 2016-04-02 LAB — CBC WITH DIFFERENTIAL/PLATELET
BASOS ABS: 0 10*3/uL (ref 0.0–0.1)
Basophils Relative: 0 %
EOS PCT: 12 %
Eosinophils Absolute: 0.7 10*3/uL (ref 0.0–0.7)
HEMATOCRIT: 17.3 % — AB (ref 36.0–46.0)
HEMOGLOBIN: 6.6 g/dL — AB (ref 12.0–15.0)
LYMPHS PCT: 54 %
Lymphs Abs: 3 10*3/uL (ref 0.7–4.0)
MCH: 34.2 pg — ABNORMAL HIGH (ref 26.0–34.0)
MCHC: 36.4 g/dL — ABNORMAL HIGH (ref 30.0–36.0)
MCV: 94 fL (ref 78.0–100.0)
MONOS PCT: 6 %
Monocytes Absolute: 0.3 10*3/uL (ref 0.1–1.0)
Neutro Abs: 1.5 10*3/uL — ABNORMAL LOW (ref 1.7–7.7)
Neutrophils Relative %: 28 %
Platelets: 286 10*3/uL (ref 150–400)
RBC: 1.84 MIL/uL — AB (ref 3.87–5.11)
RDW: 24.1 % — ABNORMAL HIGH (ref 11.5–15.5)
WBC: 5.5 10*3/uL (ref 4.0–10.5)

## 2016-04-02 LAB — MAGNESIUM: MAGNESIUM: 1.7 mg/dL (ref 1.7–2.4)

## 2016-04-02 LAB — RETICULOCYTES
RBC.: 2.02 MIL/uL — AB (ref 3.87–5.11)
RETIC COUNT ABSOLUTE: 436.3 10*3/uL — AB (ref 19.0–186.0)
RETIC CT PCT: 21.6 % — AB (ref 0.4–3.1)

## 2016-04-02 MED ORDER — DIPHENHYDRAMINE HCL 50 MG/ML IJ SOLN: 12.5000 mg | Freq: Four times a day (QID) | INTRAMUSCULAR | Status: DC | PRN

## 2016-04-02 MED ORDER — NALOXONE HCL 0.4 MG/ML IJ SOLN: 0.4000 mg | INTRAMUSCULAR | Status: DC | PRN

## 2016-04-02 MED ORDER — DIPHENHYDRAMINE HCL 12.5 MG/5ML PO ELIX: 12.5000 mg | ORAL_SOLUTION | Freq: Four times a day (QID) | ORAL | Status: DC | PRN

## 2016-04-02 MED ORDER — SODIUM CHLORIDE 0.9% FLUSH: 9.0000 mL | INTRAVENOUS | Status: DC | PRN

## 2016-04-02 MED ORDER — HYDROMORPHONE 1 MG/ML IV SOLN
INTRAVENOUS | Status: DC
  Administered 2016-04-02: 25 mg via INTRAVENOUS
  Administered 2016-04-02: 0.6 mg via INTRAVENOUS
  Administered 2016-04-02: 2.4 mg via INTRAVENOUS
  Administered 2016-04-03: 0.4 mg via INTRAVENOUS
  Administered 2016-04-03: 0 mg via INTRAVENOUS
  Administered 2016-04-03: 1.2 mg via INTRAVENOUS
  Filled 2016-04-02: qty 25

## 2016-04-02 NOTE — Progress Notes (Signed)
SICKLE CELL SERVICE PROGRESS NOTE  Valerie Reid WUJ:[id number] DOB: 07/31/1990 DOA: 03/31/2016 PCP: Ledon Snare, MD  Assessment/Plan: Active Problems:   Sickle cell crisis (HCC)   Sickle cell pain crisis (HCC)  1. Hb SS with crisis: Will resume PCA.and continue scheduled  oral Dilaudid. Continue Toradol Decrease IVF to [facility]  2. Leukocytosis: Resolved.  3. Prolonged QTc: repeat EKG showed an improvement in QT interval. Zofran discontinued due to prolonged QTc. Compazine substituted.  4. Acute Urinary retention: Resolved. Pt has voided spontaneously. 5. Nausea and Emesis: Pregnancy test negative. Likely related to the opiates.. 6. Hypoxia: resolved with Incentive spirometer use.   Code Status: Full Code Family Communication: N/A Disposition Plan: Not yet ready for discharge  MATTHEWS,MICHELLE A.  Pager [phone]. If 7PM-7AM, please contact night-coverage.  04/02/2016, 12:12 PM  LOS: 2 days  Interim History: Pt now reports that her pain was not controlled yesterday but she was scared after her experience with narcan in the ED and just wanted to go to [facility] where she normally receives her care. After a long discussion and explanation of the safety features of the PCA she is willing to stay and accept care. She also expresses that she is concerned about the hospital bill that she will incur with a prolonged hospitalization and wants to limit her hospital stay.   Consultants:  None  Procedures:  None  Antibiotics:  None   Objective: Vitals:   04/02/16 0547 04/02/16 0821 04/02/16 0823 04/02/16 1004  BP: (!) 98/49     Pulse: (!) 57 85 (!) 102   Resp: 16     Temp: 98.2 F (36.8 C)     TempSrc: Oral     SpO2: 100% 97% 94% 95%  Weight: 49.7 kg (109 lb 9.1 oz)     Height:       Weight change:   Intake/Output Summary (Last 24 hours) at 04/02/16 1212 Last data filed at 04/02/16 0824  Gross per 24 hour  Intake              0.6 ml  Output             1500 ml   Net          -1499.4 ml    General: Alert, awake, oriented x3, in moderate distress due to pain.  HEENT: [place]/AT PEERL, EOMI, anicteric. Neck: Trachea midline,  no masses, no thyromegal,y no JVD, no carotid bruit OROPHARYNX:  Moist, No exudate/ erythema/lesions.  Heart: Regular rate and rhythm, without murmurs, rubs, gallops, PMI non-displaced, no heaves or thrills on palpation.  Lungs: Clear to auscultation, no wheezing or rhonchi noted. No increased vocal fremitus resonant to percussion  Abdomen: Soft, nontender, nondistended, positive bowel sounds, no masses no hepatosplenomegaly noted.  Neuro: No focal neurological deficits noted cranial nerves II through XII grossly intact. Strength at baseline in bilateral upper and lower extremities. Musculoskeletal: No warmth swelling or erythema around joints, no spinal tenderness noted. Psychiatric: Patient alert and oriented x3, good insight and cognition, good recent to remote recall.    Data Reviewed: Basic Metabolic Panel:  Recent Labs Lab 03/31/16 0626 04/02/16 0755  NA 143 140  K 3.5 3.6  CL 112* 108  CO2 25 26  GLUCOSE 94 104*  BUN 5* <5*  CREATININE <0.30* <0.30*  CALCIUM 8.9 8.3*  MG  --  1.7   Liver Function Tests:  Recent Labs Lab 03/31/16 0626  AST 51*  ALT 19  ALKPHOS 65  BILITOT 4.4*  PROT 6.6  ALBUMIN 4.0   No results for input(s): LIPASE, AMYLASE in the last 168 hours. No results for input(s): AMMONIA in the last 168 hours. CBC:  Recent Labs Lab 03/31/16 0626 04/02/16 0755  WBC 13.2* 5.5  NEUTROABS 5.0 1.5*  HGB 7.3* 6.6*  HCT 19.9* 17.3*  MCV 94.3 94.0  PLT 347 286   Cardiac Enzymes: No results for input(s): CKTOTAL, CKMB, CKMBINDEX, TROPONINI in the last 168 hours. BNP (last 3 results) No results for input(s): BNP in the last 8760 hours.  ProBNP (last 3 results) No results for input(s): PROBNP in the last 8760 hours.  CBG: No results for input(s): GLUCAP in the last 168 hours.  Recent  Results (from the past 240 hour(s))  Respiratory Panel by PCR     Status: None   Collection Time: 03/31/16 10:28 AM  Result Value Ref Range Status   Adenovirus NOT DETECTED NOT DETECTED Final   Coronavirus 229E NOT DETECTED NOT DETECTED Final   Coronavirus HKU1 NOT DETECTED NOT DETECTED Final   Coronavirus NL63 NOT DETECTED NOT DETECTED Final   Coronavirus OC43 NOT DETECTED NOT DETECTED Final   Metapneumovirus NOT DETECTED NOT DETECTED Final   Rhinovirus / Enterovirus NOT DETECTED NOT DETECTED Final   Influenza A NOT DETECTED NOT DETECTED Final   Influenza B NOT DETECTED NOT DETECTED Final   Parainfluenza Virus 1 NOT DETECTED NOT DETECTED Final   Parainfluenza Virus 2 NOT DETECTED NOT DETECTED Final   Parainfluenza Virus 3 NOT DETECTED NOT DETECTED Final   Parainfluenza Virus 4 NOT DETECTED NOT DETECTED Final   Respiratory Syncytial Virus NOT DETECTED NOT DETECTED Final   Bordetella pertussis NOT DETECTED NOT DETECTED Final   Chlamydophila pneumoniae NOT DETECTED NOT DETECTED Final   Mycoplasma pneumoniae NOT DETECTED NOT DETECTED Final    Comment: Performed at [facility], [address] [place], [place] [address]     Studies: Dg Chest 2 View  Result Date: 03/31/2016 CLINICAL DATA:  Initial evaluation for acute sickle cell pain crisis. EXAM: CHEST  2 VIEW COMPARISON:  Prior radiograph from 08/19/2015. FINDINGS: Mild cardiomegaly, stable. Mediastinal silhouette within normal limits. Lungs normally inflated. Bilateral perihilar and upper lobe streaky perihilar opacities with architectural distortion, with additional scarring/architectural distortion within the right lung base. Changes are similar to previous exam. No definite superimposed focal airspace disease. No pulmonary edema or pleural effusion. No pneumothorax. No acute osseous abnormality. IMPRESSION: 1. Similar appearance of bilateral suprahilar and right infrahilar streaky airspace opacities with architectural distortion.  No definite superimposed focal infiltrates identified. 2. Mild cardiomegaly without pulmonary edema, stable. Electronically Signed   By: Rise MuBenjamin  McClintock M.D.   On: 03/31/2016 05:24    Scheduled Meds: . budesonide (PULMICORT) nebulizer solution  0.25 mg Nebulization BID  . enoxaparin (LOVENOX) injection  40 mg Subcutaneous Q24H  . hydroxyurea  1,500 mg Oral Daily  . ketorolac  30 mg Intravenous Q6H  . senna-docusate  1 tablet Oral BID   Continuous Infusions:  Active Problems:   Sickle cell crisis (HCC)   Sickle cell pain crisis (HCC)     In excess of 25 minutes spent during this visit. Greater than 50% involved face to face contact with the patient for assessment, counseling and coordination of care.

## 2016-04-02 NOTE — Progress Notes (Signed)
PHARMACIST - PHYSICIAN COMMUNICATION CONCERNING:  Hydrea  Hydroxyurea will be place on hold due to P[employer] approved hold criteria listed below.  Patient's ANC 1.5 today.  Hydroxyurea (Hydrea) hold criteria  ANC < 2  Pltc < 80K in sickle-cell patients; < 100K in other patients  Hgb <= 6 in sickle-cell patients; < 8 in other patients  Reticulocytes < 80K when Hgb < 9  Plan:  Discussed with Dr. Ashley RoyaltyMatthews as well. Hold Hydrea due to low ANC.  Clance BollAmanda Suezette Lafave, PharmD, BCPS Pager: [phone] 04/02/2016 4:02 PM

## 2016-04-02 NOTE — Progress Notes (Signed)
Patient used incentive spirometer obtained 1250 cc.

## 2016-04-02 NOTE — Progress Notes (Signed)
Nursing Note: Pt c/o nausea.medicated for nausea when pt vomited 200 cc of yellow-light brown liquid emesis.wbb

## 2016-04-02 NOTE — Progress Notes (Signed)
SATURATION QUALIFICATIONS: (This note is used to comply with regulatory documentation for home oxygen)  Patient Saturations on Room Air at Rest = 97%  Patient Saturations on Room Air while Ambulating = 94%  Patient Saturations on 0 Liters of oxygen while Ambulating = 0%  Please briefly explain why patient needs home oxygen: 

## 2016-04-02 NOTE — Progress Notes (Signed)
Nursing Note; Pt vomited 200 cc when up to the bathroom.wbb

## 2016-04-03 ENCOUNTER — Encounter: Payer: Self-pay | Admitting: Internal Medicine

## 2016-04-03 LAB — CBC WITH DIFFERENTIAL/PLATELET
BASOS PCT: 0 %
Band Neutrophils: 0 %
Basophils Absolute: 0 10*3/uL (ref 0.0–0.1)
Blasts: 0 %
EOS ABS: 0.2 10*3/uL (ref 0.0–0.7)
Eosinophils Relative: 3 %
HCT: 19 % — ABNORMAL LOW (ref 36.0–46.0)
Hemoglobin: 6.9 g/dL — CL (ref 12.0–15.0)
LYMPHS ABS: 3.8 10*3/uL (ref 0.7–4.0)
Lymphocytes Relative: 51 %
MCH: 33.5 pg (ref 26.0–34.0)
MCHC: 36.3 g/dL — ABNORMAL HIGH (ref 30.0–36.0)
MCV: 92.2 fL (ref 78.0–100.0)
MONO ABS: 0.5 10*3/uL (ref 0.1–1.0)
MONOS PCT: 7 %
MYELOCYTES: 0 %
Metamyelocytes Relative: 0 %
NEUTROS PCT: 39 %
NRBC: 8 /100{WBCs} — AB
Neutro Abs: 2.8 10*3/uL (ref 1.7–7.7)
PLATELETS: 321 10*3/uL (ref 150–400)
Promyelocytes Absolute: 0 %
RBC: 2.06 MIL/uL — AB (ref 3.87–5.11)
RDW: 22.6 % — AB (ref 11.5–15.5)
WBC: 7.3 10*3/uL (ref 4.0–10.5)

## 2016-04-03 LAB — COMPREHENSIVE METABOLIC PANEL
ALT: 28 U/L (ref 14–54)
ANION GAP: 6 (ref 5–15)
AST: 54 U/L — ABNORMAL HIGH (ref 15–41)
Albumin: 3.5 g/dL (ref 3.5–5.0)
Alkaline Phosphatase: 49 U/L (ref 38–126)
BUN: <5 mg/dL — ABNORMAL LOW (ref 6–20)
CHLORIDE: 108 mmol/L (ref 101–111)
CO2: 26 mmol/L (ref 22–32)
CREATININE: 0.33 mg/dL — AB (ref 0.44–1.00)
Calcium: 8.2 mg/dL — ABNORMAL LOW (ref 8.9–10.3)
Glucose, Bld: 92 mg/dL (ref 65–99)
POTASSIUM: 3.6 mmol/L (ref 3.5–5.1)
SODIUM: 140 mmol/L (ref 135–145)
Total Bilirubin: 3.1 mg/dL — ABNORMAL HIGH (ref 0.3–1.2)
Total Protein: 6.1 g/dL — ABNORMAL LOW (ref 6.5–8.1)

## 2016-04-03 LAB — RETICULOCYTES
RBC.: 2.06 MIL/uL — AB (ref 3.87–5.11)
RETIC COUNT ABSOLUTE: 372.9 10*3/uL — AB (ref 19.0–186.0)
RETIC CT PCT: 18.1 % — AB (ref 0.4–3.1)

## 2016-04-03 MED ORDER — SODIUM CHLORIDE 0.9 % IV SOLN
500.0000 mL | Freq: Once | INTRAVENOUS | Status: AC
  Administered 2016-04-03: 500 mL via INTRAVENOUS

## 2016-04-03 NOTE — Progress Notes (Signed)
Wasted 18 ml/18mg s of dilaudid pca  In sink.

## 2016-04-03 NOTE — Progress Notes (Signed)
Nursing Note: Orders received for 500 cc bolus of normal saline.wbb

## 2016-04-03 NOTE — Progress Notes (Signed)
SATURATION QUALIFICATIONS: (This note is used to comply with regulatory documentation for home oxygen)  Patient Saturations on Room Air at Rest =95  Patient Saturations on Room Air while Ambulating 98  Patient Saturations on0ters of oxygen while Ambulating =98  Please briefly explain why patient needs home oxygen:

## 2016-04-03 NOTE — Progress Notes (Signed)
Nursing Note: Bp- 82/40 manual and p-53 A: Paged on-call.wbb

## 2016-04-03 NOTE — Progress Notes (Signed)
Nursing Note: Spoke w/ Dr. Ashley RoyaltyMatthews about events of last night.Pt resting quietly in bed.wbb

## 2016-04-03 NOTE — Discharge Summary (Signed)
Valerie MaduraYaneth Salls MRN: [id number] DOB/AGE: Sep 26, 1990 26 y.o.  Admit date: 03/31/2016 Discharge date: 04/03/2016  Primary Care Physician:  Ledon SnareFARLAND,SANDRA, MD   Discharge Diagnoses:   Patient Active Problem List   Diagnosis Date Noted  . Sickle cell pain crisis (HCC) 03/31/2016  . Anemia of chronic disease 08/19/2015  . Airway hyperreactivity 03/27/2015  . FO (foramen ovale) 01/01/2013  . 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   HYDROmorphone 2 MG tablet Commonly known as:  DILAUDID Take 2 mg by mouth daily as needed for severe pain.   hydroxyurea 500 MG capsule Commonly known as:  HYDREA Take 1,500 mg by mouth daily. May take with food to minimize GI side effects.         Consults:    SIGNIFICANT DIAGNOSTIC STUDIES:  Dg Chest 2 View  Result Date: 03/31/2016 CLINICAL DATA:  Initial evaluation for acute sickle cell pain crisis. EXAM: CHEST  2 VIEW COMPARISON:  Prior radiograph from 08/19/2015. FINDINGS: Mild cardiomegaly, stable. Mediastinal silhouette within normal limits. Lungs normally inflated. Bilateral perihilar and upper lobe streaky perihilar opacities with architectural distortion, with additional scarring/architectural distortion within the right lung base. Changes are similar to previous exam. No definite superimposed focal airspace disease. No pulmonary edema or pleural effusion. No pneumothorax. No acute osseous abnormality. IMPRESSION: 1. Similar appearance of bilateral suprahilar and right infrahilar streaky airspace opacities with architectural distortion. No definite superimposed focal infiltrates identified. 2. Mild cardiomegaly without pulmonary edema, stable.  Electronically Signed   By: Rise MuBenjamin  McClintock M.D.   On: 03/31/2016 05:24       Recent Results (from the past 240 hour(s))  Respiratory Panel by PCR     Status: None   Collection Time: 03/31/16 10:28 AM  Result Value Ref Range Status   Adenovirus NOT DETECTED NOT DETECTED Final   Coronavirus 229E NOT DETECTED NOT DETECTED Final   Coronavirus HKU1 NOT DETECTED NOT DETECTED Final   Coronavirus NL63 NOT DETECTED NOT DETECTED Final   Coronavirus OC43 NOT DETECTED NOT DETECTED Final   Metapneumovirus NOT DETECTED NOT DETECTED Final   Rhinovirus / Enterovirus NOT DETECTED NOT DETECTED Final   Influenza A NOT DETECTED NOT DETECTED Final   Influenza B NOT DETECTED NOT DETECTED Final   Parainfluenza Virus 1 NOT DETECTED NOT DETECTED Final   Parainfluenza Virus 2 NOT DETECTED NOT DETECTED Final   Parainfluenza Virus 3 NOT DETECTED NOT DETECTED Final   Parainfluenza Virus 4 NOT DETECTED NOT DETECTED Final   Respiratory Syncytial Virus NOT DETECTED NOT DETECTED Final   Bordetella pertussis NOT DETECTED NOT DETECTED Final   Chlamydophila pneumoniae NOT DETECTED NOT DETECTED Final   Mycoplasma pneumoniae NOT DETECTED NOT DETECTED Final    Comment: Performed at [facility], [address] [place], [place] [address]    BRIEF ADMITTING H & P: This is an opiate naive patient with Hb SS who presented to the ED after she had pin in back and legs typical of her Sickle Cell Crisis. Pt did not have and pain medications at home as she had received her prescription on 4 days ago but did not fill it as she did not have the money to do  so. She tried to treat her pain with Ibuprofen but was unsuccessful thus she presented to the ED. Currently she rates her pain as 10/10. She denies any fevers, chills, vomiting or diarrhea.  In the ED she received 3 doses of Dilaudid 2 mg and became narcotized requiring Narcan. She also received a dose of Toradol 30 mg and IVF. I am asked to admit the patient for  ongoing Sickle cell Crisis.    Hospital Course:  Present on Admission: . Sickle cell pain crisis (HCC) This is and opiate naive patient with HB SS who was admitted with sickle cell Crisis. Her hospital course was uneventful except for the fact that she required Narcan during her ED course. Once on the wards, her pain was managed with Dilaudid PCA, Toradol and IVF. As pain improved she was transitioned to oral Dilaudid. She had some transient hypoxemia which was likely due to atelectasis and hypoventilation as it resolved wit mobilization and use of the Incentive spirometer. She is discharged home in good condition and will follow up with her Hematologist Dr. Jolyne Loa as scheduled. Pt had low BP however review of her records in Care Everywhere showed that he BP was at baseline.   Disposition and Follow-up:  Pt discharged in good condition.   Discharge Instructions    Activity as tolerated - No restrictions    Complete by:  As directed    Diet general    Complete by:  As directed       DISCHARGE EXAM:  General: Alert, awake, oriented x3, in no apparent distress.  HEENT: [place]/AT PEERL, EOMI, anicteric Neck: Trachea midline, no masses, no thyromegal,y no JVD, no carotid bruit OROPHARYNX: Moist, No exudate/ erythema/lesions.  Heart: Regular rate and rhythm, without murmurs, rubs, gallops or S3. PMI non-displaced. Exam reveals no decreased pulses. Pulmonary/Chest: Normal effort. Breath sounds normal. No. Apnea. Clear to auscultation,no stridor,  no wheezing and no rhonchi noted. No respiratory distress and no tenderness noted. Abdomen: Soft, nontender, nondistended, normal bowel sounds, no masses no hepatosplenomegaly noted. No fluid wave and no ascites. There is no guarding or rebound. Neuro: Alert and oriented to person, place and time. Normal motor skills, Displays no atrophy or tremors and exhibits normal muscle tone.  No focal neurological deficits noted cranial nerves II through XII  grossly intact. No sensory deficit noted. Strength at baseline in bilateral upper and lower extremities. Gait normal. Musculoskeletal: No warmth swelling or erythema around joints, no spinal tenderness noted. Psychiatric: Patient alert and oriented x3, good insight and cognition, good recent to remote recall.Mood, affect and judgement normal Skin: Skin is warm and dry. No bruising, no ecchymosis and no rash noted. Pt is not diaphoretic. No erythema. No pallor    Blood pressure (!) 88/48, pulse (!) 49, temperature 97.8 F (36.6 C), temperature source Oral, resp. rate 14, height 5\' 2"  (1.575 m), weight 49.7 kg (109 lb 9.1 oz), last menstrual period 03/27/2016, SpO2 100 %.   Recent Labs  04/02/16 0755 04/03/16 0744  NA 140 140  K 3.6 3.6  CL 108 108  CO2 26 26  GLUCOSE 104* 92  BUN <5* <5*  CREATININE <0.30* 0.33*  CALCIUM 8.3* 8.2*  MG 1.7  --     Recent Labs  04/03/16 0744  AST 54*  ALT 28  ALKPHOS 49  BILITOT 3.1*  PROT 6.1*  ALBUMIN 3.5   No results for input(s): LIPASE, AMYLASE in the last 72 hours.  Recent Labs  04/02/16 0755 04/03/16 0744  WBC 5.5 7.3  NEUTROABS 1.5* 2.8  HGB 6.6* 6.9*  HCT 17.3* 19.0*  MCV 94.0 92.2  PLT 286 321     Total time spent including face to face and decision making was greater than 30 minutes  Signed: Adaira Centola A. 04/03/2016, 10:05 AM

## 2016-11-25 ENCOUNTER — Inpatient Hospital Stay (HOSPITAL_COMMUNITY)
Admission: EM | Admit: 2016-11-25 | Discharge: 2016-11-29 | DRG: 812 | Disposition: A | Discharge status: Home / Self Care | Payer: Self-pay | Attending: Internal Medicine | Admitting: Internal Medicine

## 2016-11-25 ENCOUNTER — Emergency Department (HOSPITAL_COMMUNITY): Payer: Self-pay

## 2016-11-25 ENCOUNTER — Encounter (HOSPITAL_COMMUNITY): Payer: Self-pay

## 2016-11-25 DIAGNOSIS — D730 Hyposplenism: Secondary | ICD-10-CM | POA: Diagnosis present

## 2016-11-25 DIAGNOSIS — D5701 Hb-SS disease with acute chest syndrome: Secondary | ICD-10-CM | POA: Clinically undetermined

## 2016-11-25 DIAGNOSIS — D5700 Hb-SS disease with crisis, unspecified: Principal | ICD-10-CM | POA: Diagnosis present

## 2016-11-25 DIAGNOSIS — G43909 Migraine, unspecified, not intractable, without status migrainosus: Secondary | ICD-10-CM | POA: Diagnosis not present

## 2016-11-25 DIAGNOSIS — Q8901 Asplenia (congenital): Secondary | ICD-10-CM

## 2016-11-25 DIAGNOSIS — R0902 Hypoxemia: Secondary | ICD-10-CM | POA: Diagnosis present

## 2016-11-25 DIAGNOSIS — J45909 Unspecified asthma, uncomplicated: Secondary | ICD-10-CM | POA: Diagnosis present

## 2016-11-25 LAB — CBC WITH DIFFERENTIAL/PLATELET
Band Neutrophils: 2 %
Basophils Absolute: 0 10*3/uL (ref 0.0–0.1)
Basophils Relative: 0 %
Blasts: 0 %
Eosinophils Absolute: 0.4 10*3/uL (ref 0.0–0.7)
Eosinophils Relative: 3 %
HCT: 18 % — ABNORMAL LOW (ref 36.0–46.0)
Hemoglobin: 6.4 g/dL — CL (ref 12.0–15.0)
Lymphocytes Relative: 33 %
Lymphs Abs: 4 10*3/uL (ref 0.7–4.0)
MCH: 33 pg (ref 26.0–34.0)
MCHC: 35.6 g/dL (ref 30.0–36.0)
MCV: 92.8 fL (ref 78.0–100.0)
Metamyelocytes Relative: 0 %
Monocytes Absolute: 0.6 10*3/uL (ref 0.1–1.0)
Monocytes Relative: 5 %
Myelocytes: 0 %
Neutro Abs: 7.1 10*3/uL (ref 1.7–7.7)
Neutrophils Relative %: 57 %
Other: 0 %
Platelets: 289 10*3/uL (ref 150–400)
Promyelocytes Absolute: 0 %
RBC: 1.94 MIL/uL — ABNORMAL LOW (ref 3.87–5.11)
RDW: 33.3 % — ABNORMAL HIGH (ref 11.5–15.5)
WBC: 12.1 10*3/uL — ABNORMAL HIGH (ref 4.0–10.5)
nRBC: 35 /100 WBC — ABNORMAL HIGH

## 2016-11-25 LAB — COMPREHENSIVE METABOLIC PANEL
ALT: 31 U/L (ref 14–54)
AST: 107 U/L — ABNORMAL HIGH (ref 15–41)
Albumin: 3.7 g/dL (ref 3.5–5.0)
Alkaline Phosphatase: 72 U/L (ref 38–126)
Anion gap: 8 (ref 5–15)
BUN: <5 mg/dL — ABNORMAL LOW (ref 6–20)
CO2: 24 mmol/L (ref 22–32)
Calcium: 8.2 mg/dL — ABNORMAL LOW (ref 8.9–10.3)
Chloride: 106 mmol/L (ref 101–111)
Creatinine, Ser: <0.3 mg/dL — ABNORMAL LOW (ref 0.44–1.00)
Glucose, Bld: 124 mg/dL — ABNORMAL HIGH (ref 65–99)
Potassium: 4 mmol/L (ref 3.5–5.1)
Sodium: 138 mmol/L (ref 135–145)
Total Bilirubin: 7.7 mg/dL — ABNORMAL HIGH (ref 0.3–1.2)
Total Protein: 7.3 g/dL (ref 6.5–8.1)

## 2016-11-25 LAB — RETICULOCYTES
RBC.: 1.94 MIL/uL — ABNORMAL LOW (ref 3.87–5.11)
RBC.: 1.95 MIL/uL — AB (ref 3.87–5.11)
Retic Ct Pct: >23 % — ABNORMAL HIGH (ref 0.4–3.1)
Retic Ct Pct: >23 % — ABNORMAL HIGH (ref 0.4–3.1)

## 2016-11-25 LAB — I-STAT CHEM 8, ED
Calcium, Ion: 1.15 mmol/L (ref 1.15–1.40)
Chloride: 107 mmol/L (ref 101–111)
Creatinine, Ser: 0.3 mg/dL — ABNORMAL LOW (ref 0.44–1.00)
Glucose, Bld: 122 mg/dL — ABNORMAL HIGH (ref 65–99)
HEMATOCRIT: 19 % — AB (ref 36.0–46.0)
HEMOGLOBIN: 6.5 g/dL — AB (ref 12.0–15.0)
POTASSIUM: 3.9 mmol/L (ref 3.5–5.1)
Sodium: 144 mmol/L (ref 135–145)
TCO2: 27 mmol/L (ref 22–32)

## 2016-11-25 LAB — I-STAT BETA HCG BLOOD, ED (MC, WL, AP ONLY): I-stat hCG, quantitative: <5 m[IU]/mL (ref <5)

## 2016-11-25 MED ORDER — DIPHENHYDRAMINE HCL 25 MG PO CAPS
25.0000 mg | ORAL_CAPSULE | ORAL | Status: DC | PRN
  Administered 2016-11-25: 25 mg via ORAL
  Filled 2016-11-25: qty 1

## 2016-11-25 MED ORDER — POLYETHYLENE GLYCOL 3350 17 G PO PACK: 17.0000 g | PACK | Freq: Every day | ORAL | Status: DC | PRN

## 2016-11-25 MED ORDER — KETOROLAC TROMETHAMINE 30 MG/ML IJ SOLN: 30.0000 mg | Freq: Once | INTRAMUSCULAR | Status: DC

## 2016-11-25 MED ORDER — HYDROMORPHONE HCL 1 MG/ML IJ SOLN: 2.0000 mg | INTRAMUSCULAR | Status: DC

## 2016-11-25 MED ORDER — KETOROLAC TROMETHAMINE 30 MG/ML IJ SOLN
30.0000 mg | Freq: Four times a day (QID) | INTRAMUSCULAR | Status: DC
  Administered 2016-11-25 – 2016-11-27 (×8): 30 mg via INTRAVENOUS
  Filled 2016-11-25 (×8): qty 1

## 2016-11-25 MED ORDER — SODIUM CHLORIDE 0.9 % IV SOLN
Freq: Once | INTRAVENOUS | Status: AC
  Administered 2016-11-26: 05:00:00 via INTRAVENOUS

## 2016-11-25 MED ORDER — SODIUM CHLORIDE 0.9% FLUSH: 9.0000 mL | INTRAVENOUS | Status: DC | PRN

## 2016-11-25 MED ORDER — HYDROMORPHONE HCL 1 MG/ML IJ SOLN
1.0000 mg | INTRAMUSCULAR | Status: AC
Start: 2016-11-25 — End: 2016-11-25

## 2016-11-25 MED ORDER — HYDROMORPHONE HCL 1 MG/ML IJ SOLN
1.0000 mg | INTRAMUSCULAR | Status: AC
  Administered 2016-11-25: 1 mg via INTRAVENOUS
  Filled 2016-11-25: qty 1

## 2016-11-25 MED ORDER — BUDESONIDE 0.5 MG/2ML IN SUSP
0.5000 mg | Freq: Two times a day (BID) | RESPIRATORY_TRACT | Status: DC
  Administered 2016-11-25 – 2016-11-28 (×5): 0.5 mg via RESPIRATORY_TRACT
  Filled 2016-11-25 (×8): qty 2

## 2016-11-25 MED ORDER — HYDROMORPHONE HCL 1 MG/ML IJ SOLN: 2.0000 mg | INTRAMUSCULAR | Status: AC

## 2016-11-25 MED ORDER — DEXTROSE-NACL 5-0.45 % IV SOLN
INTRAVENOUS | Status: DC
  Administered 2016-11-25 – 2016-11-29 (×5): via INTRAVENOUS

## 2016-11-25 MED ORDER — HYDROMORPHONE 1 MG/ML IV SOLN
INTRAVENOUS | Status: DC
  Administered 2016-11-25: 23:00:00 via INTRAVENOUS
  Administered 2016-11-26: 2 mg via INTRAVENOUS
  Administered 2016-11-26: 1 mg via INTRAVENOUS
  Filled 2016-11-25: qty 25

## 2016-11-25 MED ORDER — DIPHENHYDRAMINE HCL 50 MG/ML IJ SOLN
12.5000 mg | Freq: Four times a day (QID) | INTRAMUSCULAR | Status: DC | PRN
  Filled 2016-11-25: qty 1

## 2016-11-25 MED ORDER — ONDANSETRON HCL 4 MG/2ML IJ SOLN
4.0000 mg | INTRAMUSCULAR | Status: DC | PRN
  Administered 2016-11-25: 4 mg via INTRAVENOUS
  Filled 2016-11-25: qty 2

## 2016-11-25 MED ORDER — SODIUM CHLORIDE 0.45 % IV SOLN
INTRAVENOUS | Status: DC
  Administered 2016-11-25: 09:00:00 via INTRAVENOUS

## 2016-11-25 MED ORDER — NALOXONE HCL 0.4 MG/ML IJ SOLN
0.4000 mg | INTRAMUSCULAR | Status: DC | PRN
  Filled 2016-11-25 (×2): qty 1

## 2016-11-25 MED ORDER — FLUTICASONE PROPIONATE HFA 110 MCG/ACT IN AERO: 2.0000 | INHALATION_SPRAY | Freq: Two times a day (BID) | RESPIRATORY_TRACT | Status: DC

## 2016-11-25 MED ORDER — HYDROMORPHONE HCL 1 MG/ML IJ SOLN
2.0000 mg | INTRAMUSCULAR | Status: AC
  Administered 2016-11-25: 2 mg via INTRAVENOUS
  Filled 2016-11-25: qty 2

## 2016-11-25 MED ORDER — KETOROLAC TROMETHAMINE 60 MG/2ML IM SOLN
30.0000 mg | Freq: Once | INTRAMUSCULAR | Status: AC
  Administered 2016-11-25: 30 mg via INTRAMUSCULAR
  Filled 2016-11-25: qty 2

## 2016-11-25 MED ORDER — DIPHENHYDRAMINE HCL 12.5 MG/5ML PO ELIX: 12.5000 mg | ORAL_SOLUTION | Freq: Four times a day (QID) | ORAL | Status: DC | PRN

## 2016-11-25 MED ORDER — HYDROXYUREA 500 MG PO CAPS: 1500.0000 mg | ORAL_CAPSULE | Freq: Every day | ORAL | Status: DC

## 2016-11-25 MED ORDER — ONDANSETRON HCL 4 MG/2ML IJ SOLN
4.0000 mg | Freq: Four times a day (QID) | INTRAMUSCULAR | Status: DC | PRN
  Administered 2016-11-25 – 2016-11-27 (×4): 4 mg via INTRAVENOUS
  Filled 2016-11-25 (×5): qty 2

## 2016-11-25 MED ORDER — FOLIC ACID 1 MG PO TABS
1.0000 mg | ORAL_TABLET | Freq: Every day | ORAL | Status: DC
  Administered 2016-11-26 – 2016-11-28 (×2): 1 mg via ORAL
  Filled 2016-11-25 (×2): qty 1

## 2016-11-25 MED ORDER — SENNOSIDES-DOCUSATE SODIUM 8.6-50 MG PO TABS
1.0000 | ORAL_TABLET | Freq: Two times a day (BID) | ORAL | Status: DC
  Administered 2016-11-25 – 2016-11-28 (×5): 1 via ORAL
  Filled 2016-11-25 (×7): qty 1

## 2016-11-25 MED ORDER — ENOXAPARIN SODIUM 40 MG/0.4ML ~~LOC~~ SOLN
40.0000 mg | SUBCUTANEOUS | Status: DC
  Administered 2016-11-27 – 2016-11-28 (×2): 40 mg via SUBCUTANEOUS
  Filled 2016-11-25 (×3): qty 0.4

## 2016-11-25 NOTE — H&P (Signed)
Valerie Reid is an 26 y.o. female.   Chief Complaint: pain in the lower back HPI: patient is a 26 year old female with history of sickle cell disease who is relatively opiate nave but also has history of asthma.  She came to the ER with low back pain and leg pain rated as 10 out of 10.  She took her medications at home without relief.  In the ER she has received multiple doses of Dilaudid but pain has persisted.  She is unable to function as she normally does.   Patient was subsequently admitted for treatment of sickle cell painful crisis.  Patient's pain was not relieved by anything.   After admission patient was noted to be hypoxic with oxygen sats in the 80s but no obvious infiltrate and no acute chest syndrome.  She has history of asthma but this only mildly wheezing.  Past Medical History:  Diagnosis Date  . Asthma   . Sickle cell anemia (HCC)     Past Surgical History:  Procedure Laterality Date  . CHOLECYSTECTOMY    . INDUCED ABORTION N/A   . port-a-cath insertion     . PORT-A-CATH REMOVAL      Family History  Problem Relation Age of Onset  . Sickle cell anemia Sister   . Sickle cell trait Mother   . Sickle cell trait Father    Social History:  reports that she has never smoked. She has never used smokeless tobacco. She reports that she does not drink alcohol or use drugs.  Allergies: No Known Allergies   (Not in a hospital admission)  Results for orders placed or performed during the hospital encounter of 11/25/16 (from the past 48 hour(s))  Comprehensive metabolic panel     Status: Abnormal   Collection Time: 11/25/16  8:48 AM  Result Value Ref Range   Sodium 138 135 - 145 mmol/L   Potassium 4.0 3.5 - 5.1 mmol/L   Chloride 106 101 - 111 mmol/L   CO2 24 22 - 32 mmol/L   Glucose, Bld 124 (H) 65 - 99 mg/dL   BUN <5 (L) 6 - 20 mg/dL   Creatinine, Ser <0.30 (L) 0.44 - 1.00 mg/dL   Calcium 8.2 (L) 8.9 - 10.3 mg/dL   Total Protein 7.3 6.5 - 8.1 g/dL   Albumin  3.7 3.5 - 5.0 g/dL   AST 107 (H) 15 - 41 U/L   ALT 31 14 - 54 U/L   Alkaline Phosphatase 72 38 - 126 U/L   Total Bilirubin 7.7 (H) 0.3 - 1.2 mg/dL   GFR calc non Af Amer NOT CALCULATED >60 mL/min   GFR calc Af Amer NOT CALCULATED >60 mL/min    Comment: (NOTE) The eGFR has been calculated using the CKD EPI equation. This calculation has not been validated in all clinical situations. eGFR's persistently <60 mL/min signify possible Chronic Kidney Disease.    Anion gap 8 5 - 15  CBC with Differential     Status: Abnormal   Collection Time: 11/25/16  8:48 AM  Result Value Ref Range   WBC 12.1 (H) 4.0 - 10.5 K/uL   RBC 1.94 (L) 3.87 - 5.11 MIL/uL   Hemoglobin 6.4 (LL) 12.0 - 15.0 g/dL    Comment: RESULT REPEATED AND VERIFIED CRITICAL RESULT CALLED TO, READ BACK BY AND VERIFIED WITH: JOSEPH,M AT 0915 ON [id number] BY HOOKER,B    HCT 18.0 (L) 36.0 - 46.0 %   MCV 92.8 78.0 - 100.0 fL   MCH 33.0 26.0 -  34.0 pg   MCHC 35.6 30.0 - 36.0 g/dL   RDW 33.3 (H) 11.5 - 15.5 %   Platelets 289 150 - 400 K/uL    Comment: RESULT REPEATED AND VERIFIED SPECIMEN CHECKED FOR CLOTS CONSISTENT WITH PREVIOUS RESULT    Neutrophils Relative % 57 %   Lymphocytes Relative 33 %   Monocytes Relative 5 %   Eosinophils Relative 3 %   Basophils Relative 0 %   Band Neutrophils 2 %   Metamyelocytes Relative 0 %   Myelocytes 0 %   Promyelocytes Absolute 0 %   Blasts 0 %   nRBC 35 (H) 0 /100 WBC   Other 0 %   Neutro Abs 7.1 1.7 - 7.7 K/uL   Lymphs Abs 4.0 0.7 - 4.0 K/uL   Monocytes Absolute 0.6 0.1 - 1.0 K/uL   Eosinophils Absolute 0.4 0.0 - 0.7 K/uL   Basophils Absolute 0.0 0.0 - 0.1 K/uL   RBC Morphology POLYCHROMASIA PRESENT     Comment: TARGET CELLS SICKLE CELLS RARE NRBCs   Reticulocytes     Status: Abnormal   Collection Time: 11/25/16  8:48 AM  Result Value Ref Range   Retic Ct Pct >23.0 (H) 0.4 - 3.1 %   RBC. 1.94 (L) 3.87 - 5.11 MIL/uL   Retic Count, Absolute NOT CALCULATED 19.0 - 186.0 K/uL   I-Stat Beta hCG blood, ED ([facility], [facility], [facility] only)     Status: None   Collection Time: 11/25/16  8:59 AM  Result Value Ref Range   I-stat hCG, quantitative <5.0 <5 mIU/mL   Comment 3            Comment:   GEST. AGE      CONC.  (mIU/mL)   <=1 WEEK        5 - 50     2 WEEKS       50 - 500     3 WEEKS       100 - 10,000     4 WEEKS     1,000 - 30,000        FEMALE AND NON-PREGNANT FEMALE:     LESS THAN 5 mIU/mL   I-Stat Chem 8, ED     Status: Abnormal   Collection Time: 11/25/16  9:01 AM  Result Value Ref Range   Sodium 144 135 - 145 mmol/L   Potassium 3.9 3.5 - 5.1 mmol/L   Chloride 107 101 - 111 mmol/L   BUN <3 (L) 6 - 20 mg/dL   Creatinine, Ser 0.30 (L) 0.44 - 1.00 mg/dL   Glucose, Bld 122 (H) 65 - 99 mg/dL   Calcium, Ion 1.15 1.15 - 1.40 mmol/L   TCO2 27 22 - 32 mmol/L   Hemoglobin 6.5 (LL) 12.0 - 15.0 g/dL   HCT 19.0 (L) 36.0 - 46.0 %   Comment NOTIFIED PHYSICIAN    Dg Chest 2 View  Result Date: 11/25/2016 CLINICAL DATA:  Shortness of breath and sickle cell crisis EXAM: CHEST  2 VIEW COMPARISON:  08/18/2016 FINDINGS: Cardiac shadow is mildly enlarged. The lungs demonstrate some chronic changes bilaterally. No focal infiltrate or sizable effusion is seen. No bony abnormality is noted. IMPRESSION: Chronic scarring stable from the prior exam. No acute abnormality noted. Electronically Signed   By: Inez Catalina M.D.   On: 11/25/2016 10:09    Review of Systems  Constitutional: Positive for diaphoresis and fever.  HENT: Negative.   Eyes: Negative.   Respiratory: Positive for shortness of breath.  Cardiovascular: Negative.  Negative for chest pain.  Gastrointestinal: Negative.   Genitourinary: Negative.   Musculoskeletal: Positive for back pain, joint pain and myalgias.  Skin: Negative.   Neurological: Negative.   Endo/Heme/Allergies: Negative.   Psychiatric/Behavioral: Negative.     Blood pressure (!) 102/55, pulse 83, temperature 97.7 F (36.5 C), temperature source Oral,  resp. rate 17, height '5\' 2"'  (1.575 m), weight 50.3 kg (111 lb), last menstrual period 10/21/2016, SpO2 97 %. Physical Exam  Constitutional: She is oriented to person, place, and time. She appears well-developed and well-nourished.  HENT:  Head: Normocephalic and atraumatic.  Eyes: Pupils are equal, round, and reactive to light. Conjunctivae are normal.  Neck: Normal range of motion. Neck supple.  Cardiovascular: Normal rate, regular rhythm and normal heart sounds.   Respiratory: Effort normal and breath sounds normal. No respiratory distress. She has no wheezes.  GI: Soft. Bowel sounds are normal.  Musculoskeletal: Normal range of motion.  Neurological: She is alert and oriented to person, place, and time.  Skin: Skin is warm and dry.  Psychiatric: She has a normal mood and affect.     Assessment/Plan A 26 year old female being admitted with acute sickle cell crisis with mild hypoxemia  #1 sickle cell painful crisis: Patient will be admitted and we'll place her PCA with toradol and IV fluid as tolerated.  She has received multiple doses of Dilaudid in the emergency room and is currently drowsy.  We will restart the PCA once she is more awake and alert.  #2 Acute hypoxemia: Probably related to her asthma but also acute sickle cell crisis.  Patient will be maintained on oxygen and monitor on telemetry.  #3 history of asthma: Empiric nebulizer due to patient's hypoxemia.  #4 sickle cell anemia: Patient's hemoglobin appears to be at baseline.  If she continues to be hypoxic we will tran 1 unit of packed red blood cells.  Barbette Merino, MD 11/25/2016, 12:21 PM

## 2016-11-25 NOTE — ED Notes (Signed)
Call report but nurse busy at this time. She will call me back.

## 2016-11-25 NOTE — ED Triage Notes (Signed)
Pt here with sickle cell pain.   Pt has had pain since yesterday on back and legs.  Initial sats 81%.  Denies chest pain.  Slight sob with ambulation.  Pt also is 4 days late on menstrual.  Is sexually active and not using bc.

## 2016-11-25 NOTE — ED Provider Notes (Signed)
[facility]-EMERGENCY DEPT Provider Note   CSN: [id number] Arrival date & time: 11/25/16  4098     History   Chief Complaint Chief Complaint  Patient presents with  . Sickle Cell Pain Crisis  . Back Pain    HPI   Blood pressure 98/65, pulse 86, temperature 97.7 F (36.5 C), temperature source Oral, resp. rate 16, height  (1.575 m), weight 50.3 kg (111 lb), last menstrual period 10/21/2016, SpO2 92 %.  Valerie Reid is a 26 y.o. female complaining of low back and bilateral lower extremity pain worsening over the last several days, woke her from sleep last night, this is consistent with her prior sickle cell pain crises. She also has associated shortness of breath which is also typical. She denies any fevers, chills, cough, chest pain. She takes Dilaudid by mouth but this is not alleviated her pain.  Past Medical History:  Diagnosis Date  . Asthma   . Sickle cell anemia [facility])     Patient Active Problem List   Diagnosis Date Noted  . Sickle cell pain crisis (HCC) 03/31/2016  . Anemia of chronic disease 08/19/2015  . Airway hyperreactivity 03/27/2015  . FO (foramen ovale) 01/01/2013  . 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 CHOLECYSTECTOMY    . INDUCED ABORTION N/A   . port-a-cath insertion     . PORT-A-CATH REMOVAL      OB History    No data available       Home Medications    Prior to Admission medications   Medication Sig Start Date End Date Taking? Authorizing Provider  fluticasone (FLOVENT HFA) 110 MCG/ACT inhaler Inhale 2 puffs into the lungs 2 (two) times daily.   Yes [provider]  folic acid (FOLVITE) 1 MG tablet Take 1 mg by mouth daily.   Yes [provider]  hydroxyurea (HYDREA) 500 MG capsule Take 1,500 mg by mouth daily. May take with food to minimize GI side effects.    [provider]    Family  History Family History  Problem Relation Age of Onset  . Sickle cell anemia Sister   . Sickle cell trait Mother   . Sickle cell trait Father     Social History Social History  Substance Use Topics  . Smoking status: Never Smoker  . Smokeless tobacco: Never Used  . Alcohol use No     Allergies   Patient has no known allergies.   Review of Systems Review of Systems  A complete review of systems was obtained and all systems are negative except as noted in the HPI and PMH.   Physical Exam Updated Vital Signs BP 95/61   Pulse 71   Temp 97.7 F (36.5 C) (Oral)   Resp 11   Ht  (1.575 m)   Wt 50.3 kg (111 lb)   LMP 10/21/2016   SpO2 98%   BMI 20.30 kg/m   Physical Exam  Constitutional: She is oriented to person, place, and time. She appears well-developed and well-nourished. No distress.  HENT:  Head: Normocephalic and atraumatic.  Mouth/Throat: Oropharynx is clear and moist.  Eyes: Pupils are equal, round, and reactive to light. Conjunctivae and EOM are normal. Scleral icterus is present.  Neck: Normal range of motion. No JVD present. No tracheal deviation present.  Cardiovascular: Normal rate, regular rhythm and intact distal pulses.   Radial pulse equal bilaterally  Pulmonary/Chest: Effort normal and breath sounds normal. No stridor. No respiratory distress. She has no wheezes. She has no rales. She exhibits no tenderness.  Abdominal: Soft. She exhibits no distension and no mass. There is no tenderness. There is no rebound and no guarding. No hernia.  Musculoskeletal: Normal range of motion. She exhibits no edema or tenderness.  No calf asymmetry, superficial collaterals, palpable cords, edema, Homans sign negative bilaterally.    Neurological: She is alert and oriented to person, place, and time.  Skin: Skin is warm. She is not diaphoretic.  Psychiatric: She has a normal mood and affect.  Nursing note and vitals reviewed.    ED Treatments / Results   Labs (all labs ordered are listed, but only abnormal results are displayed) Labs Reviewed  COMPREHENSIVE METABOLIC PANEL - Abnormal; Notable for the following:       Result Value   Glucose, Bld 124 (*)    BUN <5 (*)    Creatinine, Ser <0.30 (*)    Calcium 8.2 (*)    AST 107 (*)    Total Bilirubin 7.7 (*)    All other components within normal limits  CBC WITH DIFFERENTIAL/PLATELET - Abnormal; Notable for the following:    WBC 12.1 (*)    RBC 1.94 (*)    Hemoglobin 6.4 (*)    HCT 18.0 (*)    RDW 33.3 (*)    nRBC 35 (*)    All other components within normal limits  RETICULOCYTES - Abnormal; Notable for the following:    Retic Ct Pct >23.0 (*)    RBC. 1.94 (*)    All other components within normal limits  I-STAT CHEM 8, ED - Abnormal; Notable for the following:    BUN <3 (*)    Creatinine, Ser 0.30 (*)    Glucose, Bld 122 (*)    Hemoglobin 6.5 (*)    HCT 19.0 (*)    All other components within normal limits  I-STAT BETA HCG BLOOD, ED ([facility], [facility], [facility] ONLY)    EKG  EKG Interpretation None       Radiology Dg Chest 2 View  Result Date: 11/25/2016 CLINICAL DATA:  Shortness of breath and sickle cell crisis EXAM: CHEST  2 VIEW COMPARISON:  08/18/2016 FINDINGS: Cardiac shadow is mildly enlarged. The lungs demonstrate some chronic changes bilaterally. No focal infiltrate or sizable effusion is seen. No bony abnormality is noted. IMPRESSION: Chronic scarring stable from the prior exam. No acute abnormality noted. Electronically Signed   By: Alcide Clever M.D.   On: 11/25/2016 10:09    Procedures Procedures (including critical care time)  Medications Ordered in ED Medications  0.45 % sodium chloride infusion ( Intravenous New Bag/Given 11/25/16 0854)  diphenhydrAMINE (BENADRYL) capsule 25-50 mg (25 mg Oral Given 11/25/16 0904)  ondansetron (ZOFRAN) injection 4 mg (4 mg Intravenous Given 11/25/16 0904)  HYDROmorphone (DILAUDID) injection 1 mg (not administered)    Or  HYDROmorphone  (DILAUDID) injection 1 mg (not administered)  HYDROmorphone (DILAUDID) injection 1 mg (not administered)    Or  HYDROmorphone (DILAUDID) injection 1 mg (not administered)  HYDROmorphone (DILAUDID) injection 2 mg (2 mg Intravenous Given 11/25/16 0905)    Or  HYDROmorphone (DILAUDID) injection 2 mg ( Subcutaneous See Alternative 11/25/16 0905)  ketorolac (TORADOL) injection 30 mg (30 mg Intramuscular Given 11/25/16 1030)     Initial Impression / Assessment and Plan / ED Course  I have reviewed the triage vital signs and the nursing notes.  Pertinent labs &  imaging results that were available during my care of the patient were reviewed by me and considered in my medical decision making (see chart for details).    Vitals:   11/25/16 0749 11/25/16 0752 11/25/16 0930  BP: 98/65  95/61  Pulse: 86  71  Resp: 16  11  Temp: 97.7 F (36.5 C)    TempSrc: Oral    SpO2: (!) 81% 92% 98%  Weight: 50.3 kg (111 lb)    Height:  (1.575 m)      Medications  0.45 % sodium chloride infusion ( Intravenous New Bag/Given 11/25/16 0854)  diphenhydrAMINE (BENADRYL) capsule 25-50 mg (25 mg Oral Given 11/25/16 0904)  ondansetron (ZOFRAN) injection 4 mg (4 mg Intravenous Given 11/25/16 0904)  HYDROmorphone (DILAUDID) injection 1 mg (not administered)    Or  HYDROmorphone (DILAUDID) injection 1 mg (not administered)  HYDROmorphone (DILAUDID) injection 1 mg (not administered)    Or  HYDROmorphone (DILAUDID) injection 1 mg (not administered)  HYDROmorphone (DILAUDID) injection 2 mg (2 mg Intravenous Given 11/25/16 0905)    Or  HYDROmorphone (DILAUDID) injection 2 mg ( Subcutaneous See Alternative 11/25/16 0905)  ketorolac (TORADOL) injection 30 mg (30 mg Intramuscular Given 11/25/16 1030)    Valerie Reid is 26 y.o. female presenting with Low back, bilateral lower extremity pain consistent with prior sickle cell pain crises, she also has shortness of breath I think is likely secondary to her anemia,  she has no associated chest pain, cough, fever. Lung sounds clear, Chest x-ray without infiltrate. Hemoglobin is low at 6.4, T Bili 7.7, reticulocytes to high to be measured. Will need admission for sickle cell pain crises. Discussed with Dr. Mikeal Hawthorne who accepts admission    Final Clinical Impressions(s) / ED Diagnoses   Final diagnoses:  Sickle cell anemia with crisis [facility])    New Prescriptions New Prescriptions   No medications on file     Kaylyn Lim 11/25/16 1054    Raeford Razor, MD 11/25/16 1135

## 2016-11-25 NOTE — Progress Notes (Signed)
Pt admitted to unit. Pain 8/10. RR dropping between 6-14/min. Oxygen at 77% after transferring from stretcher to bed. Recovered to 95% on 3.5L [place]. Pt very lethargic, but arousable. Dr. Mikeal Hawthorne paged, no cardiac monitoring on this floor, pt to be transferred to higher level of care.

## 2016-11-25 NOTE — Progress Notes (Signed)
Pt solmnent. pca not initiated yet Writer hesitates to do so at this time. Will continue to monitor closely.

## 2016-11-26 LAB — CBC WITH DIFFERENTIAL/PLATELET
BASOS ABS: 0 10*3/uL (ref 0.0–0.1)
Band Neutrophils: 0 %
Basophils Relative: 0 %
Blasts: 0 %
EOS ABS: 0.4 10*3/uL (ref 0.0–0.7)
EOS PCT: 4 %
HCT: 18.8 % — ABNORMAL LOW (ref 36.0–46.0)
HEMOGLOBIN: 6.6 g/dL — AB (ref 12.0–15.0)
Lymphocytes Relative: 55 %
Lymphs Abs: 5.8 10*3/uL — ABNORMAL HIGH (ref 0.7–4.0)
MCH: 32.5 pg (ref 26.0–34.0)
MCHC: 35.1 g/dL (ref 30.0–36.0)
MCV: 92.6 fL (ref 78.0–100.0)
METAMYELOCYTES PCT: 0 %
MONO ABS: 1 10*3/uL (ref 0.1–1.0)
MYELOCYTES: 0 %
Monocytes Relative: 10 %
Neutro Abs: 3.2 10*3/uL (ref 1.7–7.7)
Neutrophils Relative %: 31 %
PLATELETS: 299 10*3/uL (ref 150–400)
Promyelocytes Absolute: 0 %
RBC: 2.03 MIL/uL — AB (ref 3.87–5.11)
RDW: 31.1 % — ABNORMAL HIGH (ref 11.5–15.5)
WBC: 10.4 10*3/uL (ref 4.0–10.5)
nRBC: 69 /100 WBC — ABNORMAL HIGH

## 2016-11-26 LAB — HIV ANTIBODY (ROUTINE TESTING W REFLEX): HIV SCREEN 4TH GENERATION: NONREACTIVE

## 2016-11-26 LAB — COMPREHENSIVE METABOLIC PANEL
ALT: 31 U/L (ref 14–54)
AST: 82 U/L — AB (ref 15–41)
Albumin: 3.7 g/dL (ref 3.5–5.0)
Alkaline Phosphatase: 69 U/L (ref 38–126)
Anion gap: 7 (ref 5–15)
BILIRUBIN TOTAL: 4.8 mg/dL — AB (ref 0.3–1.2)
CHLORIDE: 107 mmol/L (ref 101–111)
CO2: 26 mmol/L (ref 22–32)
CREATININE: 0.38 mg/dL — AB (ref 0.44–1.00)
Calcium: 8.4 mg/dL — ABNORMAL LOW (ref 8.9–10.3)
GFR calc Af Amer: >60 mL/min (ref >60)
GLUCOSE: 106 mg/dL — AB (ref 65–99)
Potassium: 4.4 mmol/L (ref 3.5–5.1)
Sodium: 140 mmol/L (ref 135–145)
Total Protein: 7.6 g/dL (ref 6.5–8.1)

## 2016-11-26 LAB — PREPARE RBC (CROSSMATCH)

## 2016-11-26 MED ORDER — HYDROMORPHONE 1 MG/ML IV SOLN: Freq: Four times a day (QID) | INTRAVENOUS | Status: DC

## 2016-11-26 MED ORDER — ORAL CARE MOUTH RINSE
15.0000 mL | Freq: Two times a day (BID) | OROMUCOSAL | Status: DC
  Administered 2016-11-26 – 2016-11-27 (×3): 15 mL via OROMUCOSAL

## 2016-11-26 NOTE — Progress Notes (Signed)
Subjective: A 26 year old female admitted with sickle cell painful crisis.  Patient was having this sinus tachycardia also  As well as hypoxemia.  She is being transfused 1 unit of packed red blood cells this morning.  Hemoglobin is at 6.6 with her previous baseline of around 7-8 g. Pain is improved down to 8 out of 10.  She has been off the Dilaudid PCA yesterday due to some malaise and has only used 3 mg with 11 demands on 6 deliveries so far this morning.  Denied any lls no nausea vomiting or diarrhea.  Objective: Vital signs in last 24 hours: Temp:  [97.4 F (36.3 C)-98.2 F (36.8 C)] 97.9 F (36.6 C) (09/23 0548) Pulse Rate:  [60-89] 65 (09/23 0548) Resp:  [8-23] 10 (09/23 0800) BP: (92-112)/(53-66) 112/55 (09/23 0548) SpO2:  [77 %-100 %] 97 % (09/23 0800) Weight change:  Last BM Date: 11/25/16  Intake/Output from previous day: 09/22 0701 - 09/23 0700 In: 2477.9 [I.V.:2447.9; Blood:30] Out: 300 [Emesis/NG output:300] Intake/Output this shift: No intake/output data recorded.  General appearance: alert, cooperative, appears stated age and no distress Neck: no adenopathy, no carotid bruit, no JVD, supple, symmetrical, trachea midline and thyroid not enlarged, symmetric, no tenderness/mass/nodules Back: symmetric, no curvature. ROM normal. No CVA tenderness. Resp: clear to auscultation bilaterally Chest wall: no tenderness Cardio: regular rate and rhythm, S1, S2 normal, no murmur, click, rub or gallop GI: soft, non-tender; bowel sounds normal; no masses,  no organomegaly Extremities: extremities normal, atraumatic, no cyanosis or edema Pulses: 2+ and symmetric Neurologic: Grossly normal  Lab Results:  Recent Labs  11/25/16 0848 11/25/16 0901 11/26/16 0516  WBC 12.1*  --  10.4  HGB 6.4* 6.5* 6.6*  HCT 18.0* 19.0* 18.8*  PLT 289  --  299   BMET  Recent Labs  11/25/16 0848 11/25/16 0901 11/26/16 0516  NA 138 144 140  K 4.0 3.9 4.4  CL 106 107 107  CO2 24  --  26   GLUCOSE 124* 122* 106*  BUN <5* <3* <5*  CREATININE <0.30* 0.30* 0.38*  CALCIUM 8.2*  --  8.4*    Studies/Results: Dg Chest 2 View  Result Date: 11/25/2016 CLINICAL DATA:  Shortness of breath and sickle cell crisis EXAM: CHEST  2 VIEW COMPARISON:  08/18/2016 FINDINGS: Cardiac shadow is mildly enlarged. The lungs demonstrate some chronic changes bilaterally. No focal infiltrate or sizable effusion is seen. No bony abnormality is noted. IMPRESSION: Chronic scarring stable from the prior exam. No acute abnormality noted. Electronically Signed   By: Alcide Clever M.D.   On: 11/25/2016 10:09    Medications: I have reviewed the patient's current medications.  Assessment/Plan: A 26 yo admitted with hypoxemia and acute sickle cell crisis.  #1. Sickle Cell Painful Crisis: Will continue PCA with Toradol and IVF. Patient relatively Opiate naive.  #2. Hypoxemia: Related to [facility] and her Asthma history but not wheezing.  #3. History of asthma: Patient is not wheezing today.  Continue oxygen.  May add empiric nebulizer  #4 leukocytosis: White count is normal now.  Continue monitoring  #5 sickle cell anemia: Patient will be transfused 1 unit of packed red blood cells.  Monitor reticulocyte and H&H.   LOS: 1 day   Valerie Reid,LAWAL 11/26/2016, 8:17 AM

## 2016-11-26 NOTE — Progress Notes (Signed)
Pt becomes nauseas and vomits after every meal so far today.

## 2016-11-27 DIAGNOSIS — G43019 Migraine without aura, intractable, without status migrainosus: Secondary | ICD-10-CM

## 2016-11-27 DIAGNOSIS — D638 Anemia in other chronic diseases classified elsewhere: Secondary | ICD-10-CM

## 2016-11-27 LAB — CBC WITH DIFFERENTIAL/PLATELET
BLASTS: 0 %
Band Neutrophils: 0 %
Basophils Absolute: 0 10*3/uL (ref 0.0–0.1)
Basophils Relative: 0 %
EOS PCT: 0 %
Eosinophils Absolute: 0 10*3/uL (ref 0.0–0.7)
HEMATOCRIT: 20.9 % — AB (ref 36.0–46.0)
HEMOGLOBIN: 7.6 g/dL — AB (ref 12.0–15.0)
Lymphocytes Relative: 32 %
Lymphs Abs: 3.2 10*3/uL (ref 0.7–4.0)
MCH: 32.3 pg (ref 26.0–34.0)
MCHC: 36.4 g/dL — ABNORMAL HIGH (ref 30.0–36.0)
MCV: 88.9 fL (ref 78.0–100.0)
METAMYELOCYTES PCT: 0 %
MONO ABS: 1 10*3/uL (ref 0.1–1.0)
MYELOCYTES: 0 %
Monocytes Relative: 10 %
NEUTROS PCT: 58 %
Neutro Abs: 5.9 10*3/uL (ref 1.7–7.7)
PROMYELOCYTES ABS: 0 %
Platelets: 282 10*3/uL (ref 150–400)
RBC: 2.35 MIL/uL — AB (ref 3.87–5.11)
RDW: 24.6 % — ABNORMAL HIGH (ref 11.5–15.5)
WBC: 10.1 10*3/uL (ref 4.0–10.5)
nRBC: 50 /100 WBC — ABNORMAL HIGH

## 2016-11-27 LAB — COMPREHENSIVE METABOLIC PANEL
ALK PHOS: 81 U/L (ref 38–126)
ALT: 36 U/L (ref 14–54)
AST: 96 U/L — ABNORMAL HIGH (ref 15–41)
Albumin: 3.8 g/dL (ref 3.5–5.0)
Anion gap: 6 (ref 5–15)
BILIRUBIN TOTAL: 5.8 mg/dL — AB (ref 0.3–1.2)
BUN: 7 mg/dL (ref 6–20)
CALCIUM: 8.6 mg/dL — AB (ref 8.9–10.3)
CO2: 26 mmol/L (ref 22–32)
Chloride: 106 mmol/L (ref 101–111)
GLUCOSE: 93 mg/dL (ref 65–99)
Potassium: 4.7 mmol/L (ref 3.5–5.1)
SODIUM: 138 mmol/L (ref 135–145)
TOTAL PROTEIN: 7.3 g/dL (ref 6.5–8.1)

## 2016-11-27 LAB — BPAM RBC
BLOOD PRODUCT EXPIRATION DATE: 201810262359
ISSUE DATE / TIME: 201809230510
UNIT TYPE AND RH: 5100

## 2016-11-27 LAB — TYPE AND SCREEN
ABO/RH(D): O POS
ANTIBODY SCREEN: NEGATIVE
UNIT DIVISION: 0

## 2016-11-27 LAB — RETICULOCYTES
RBC.: 2.35 MIL/uL — AB (ref 3.87–5.11)
Retic Ct Pct: >23 % — ABNORMAL HIGH (ref 0.4–3.1)

## 2016-11-27 MED ORDER — NAPROXEN 500 MG PO TABS
250.0000 mg | ORAL_TABLET | Freq: Once | ORAL | Status: AC
  Administered 2016-11-27: 250 mg via ORAL
  Filled 2016-11-27: qty 1

## 2016-11-27 MED ORDER — NAPROXEN 500 MG PO TABS
250.0000 mg | ORAL_TABLET | Freq: Two times a day (BID) | ORAL | Status: DC
  Administered 2016-11-27 – 2016-11-29 (×2): 250 mg via ORAL
  Filled 2016-11-27 (×3): qty 1

## 2016-11-27 MED ORDER — ONDANSETRON HCL 4 MG/2ML IJ SOLN: 4.0000 mg | Freq: Four times a day (QID) | INTRAMUSCULAR | Status: DC | PRN

## 2016-11-27 MED ORDER — DIPHENHYDRAMINE HCL 50 MG/ML IJ SOLN
12.5000 mg | Freq: Once | INTRAMUSCULAR | Status: AC
Start: 2016-11-27 — End: 2016-11-27
  Administered 2016-11-27: 12.5 mg via INTRAVENOUS

## 2016-11-27 MED ORDER — HYDROMORPHONE HCL 4 MG PO TABS
4.0000 mg | ORAL_TABLET | ORAL | Status: DC | PRN
  Administered 2016-11-29: 4 mg via ORAL
  Filled 2016-11-27 (×3): qty 1

## 2016-11-27 MED ORDER — ONDANSETRON 4 MG PO TBDP: 4.0000 mg | ORAL_TABLET | Freq: Three times a day (TID) | ORAL | Status: DC | PRN

## 2016-11-27 MED ORDER — ASPIRIN-ACETAMINOPHEN-CAFFEINE 250-250-65 MG PO TABS
1.0000 | ORAL_TABLET | Freq: Four times a day (QID) | ORAL | Status: DC | PRN
  Filled 2016-11-27: qty 1

## 2016-11-27 MED ORDER — PROCHLORPERAZINE EDISYLATE 5 MG/ML IJ SOLN
10.0000 mg | Freq: Once | INTRAMUSCULAR | Status: AC
  Administered 2016-11-27: 10 mg via INTRAVENOUS
  Filled 2016-11-27: qty 2

## 2016-11-27 NOTE — Progress Notes (Signed)
Pt drowsy and difficult to arouse when I came on duty at [facility]. Warning bells on PCA machine continuously ringing out d/t ETV of 60-70- and resp at 6 or below. O2 sats were in mid to hi 80's. Her O2 sensor was turned backwards so we fixed that.  Per nurse on day shift pt had not used PCA pump since it had been last cleared and the present reading was 0.  We continued to speak with pt trying to get her to take deep breaths and blow out. Pt was responsive, and irritated about [place] having sat her up and turned lights on. I turned PCA off and we continued to encourage her to breath deeply. Pt tried several times to use bedpan and urinate, but was unable. I contacted on-call to get an order to In &Out cath. I removed 600 mls of amber urine. I also asked on-call to lower parameters for pt PCA and we both agreed that since she couldn't urinate that she was probably overmedicated. The parameters were not changed. PCA was turned back on. I was called to pts room because she was unable to use her PCA. She had her O2 sensor turned the wrong way. I informed her that she needed to turn it around, so she did, but she said she shouldn't have to wear something that bothers her. She called the desk and asked for a new nurse. Sonya lamb, the charge nurse, took over her care around midnight.

## 2016-11-27 NOTE — Progress Notes (Signed)
Patient complained of increased nausea and headache this am. Discussed order for medication to assist. Will administer Zofran. Pt not connected to PCA, Per RN report from night shift CO2 and SPO2 abnormal and order received to discontinue. Order remains in computer will discuss with provider. Pt reports pain as being managed at this time. Pt is drowsy but easy to arouse.

## 2016-11-27 NOTE — Progress Notes (Signed)
Patient reports increased, continued nausea and vomiting. No emesis witnessed. Zofran administered this am. Pt reports having some difficulty with urination. Will complete bladder scan. Will update provider.

## 2016-11-27 NOTE — Progress Notes (Signed)
SICKLE CELL SERVICE PROGRESS NOTE  Valerie Reid WUJ:[id number] DOB: 12/06/1990 DOA: 11/25/2016 PCP: Ledon Snare, MD  Assessment/Plan: Principal Problem:   Sickle cell pain crisis [facility]) Active Problems:   Functional asplenia   Sickle cell disease with crisis (HCC)  1. Migraine Headache: Pt having a migraine HA but with a h/o CVA vasoconstrictive agents must be avoided. Will give a trial of headache cocktail with IV Toradol and IV Compazine and pre-medicate with IV Benadryl.  2. Hb SS with Crisis. Will resume oral Dilaudid and continue TOradol.  3. Anemia of Chronic Disease: PT was symptomatic from the anemia and was transfused 1 unit RBC. Hb now 7.6 g/dL post transfusion. Baseline Hb unknown.  Reticulocytosis robust. Continue Folic Acid.    Code Status: Full Code Family Communication: N/A Disposition Plan: Not yet ready for discharge  Valerie A.  Pager [phone]. If 7PM-7AM, please contact night-coverage.  11/27/2016, 11:43 AM  LOS: 2 days   Interim History: Valerie Reid is an opiate naive patient with Hb SS who presented with sickle cell crisis. Currently she reports that the pain she is experiencing is dueto migraine headache. She describes the pain as Bi-temporal at an intensity of 8/10 and non-radiating. She has an aversion to light. Pt is also experiencing nausea and emesis that is resistant to Zofran.  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    Consultants:  None  Procedures:  None  Antibiotics:  None    Objective: Vitals:   11/26/16 2024 11/27/16 0200 11/27/16 0338 11/27/16 1042  BP: (!) 94/52 111/65 (!) 95/54 (!) (P) 104/59  Pulse: 72 92 91 (P) 84  Resp: 13 (!) 9 (!) 8 (P) 11  Temp: 98.7 F (37.1 C)  98.5 F (36.9 C) (P) 98.8 F (37.1 C)  TempSrc: Oral  Oral (P) Oral  SpO2: 96% 97% 98%   Weight:       Height:       Weight change:   Intake/Output Summary (Last 24 hours) at 11/27/16 1143 Last data filed at 11/27/16 0600  Gross per 24 hour  Intake              120 ml  Output              950 ml  Net             -830 ml      Physical Exam General: Alert, awake, oriented x3, in distress due to HA, Nausea and emesis.         Marland Kitchen  HEENT: [place]/AT PEERL, EOMI, anicteric Neck: Supple and without stiffness or pain with AROM. Trachea midline,  no masses, no thyromegal,y no JVD, no carotid bruit OROPHARYNX:  Moist, No exudate/ erythema/lesions.  Heart: Regular rate and rhythm, without murmurs, rubs, gallops, PMI non-displaced, no heaves or thrills on palpation.  Lungs: Clear to auscultation, no wheezing or rhonchi noted. No increased vocal fremitus resonant to percussion  Abdomen: Soft, nontender, nondistended, positive bowel sounds, no masses no hepatosplenomegaly noted..  Neuro: No focal neurological deficits noted cranial nerves II through XII grossly intact.Unable to assess strength due to HA but patient moving all extremities against gravity.  Musculoskeletal: No warmth swelling or erythema around joints, no spinal tenderness noted. Psychiatric: Patient alert and oriented x3, good insight and cognition, good recent to remote recall.    Data Reviewed: Basic Metabolic Panel:  Recent Labs  Lab 11/25/16 0848 11/25/16 0901 11/26/16 0516 11/27/16 0539  NA 138 144 140 138  K 4.0 3.9 4.4 4.7  CL 106 107 107 106  CO2 24  --  26 26  GLUCOSE 124* 122* 106* 93  BUN <5* <3* <5* 7  CREATININE <0.30* 0.30* 0.38* <0.30*  CALCIUM 8.2*  --  8.4* 8.6*   Liver Function Tests:  Recent Labs Lab 11/25/16 0848 11/26/16 0516 11/27/16 0539  AST 107* 82* 96*  ALT 31 31 36  ALKPHOS 72 69 81  BILITOT 7.7* 4.8* 5.8*  PROT 7.3 7.6 7.3  ALBUMIN 3.7 3.7 3.8   No results for input(s): LIPASE, AMYLASE in the last 168 hours. No results for input(s): AMMONIA in the last 168 hours. CBC:  Recent  Labs Lab 11/25/16 0848 11/25/16 0901 11/26/16 0516 11/27/16 0539  WBC 12.1*  --  10.4 10.1  NEUTROABS 7.1  --  3.2 5.9  HGB 6.4* 6.5* 6.6* 7.6*  HCT 18.0* 19.0* 18.8* 20.9*  MCV 92.8  --  92.6 88.9  PLT 289  --  299 282   Cardiac Enzymes: No results for input(s): CKTOTAL, CKMB, CKMBINDEX, TROPONINI in the last 168 hours. BNP (last 3 results) No results for input(s): BNP in the last 8760 hours.  ProBNP (last 3 results) No results for input(s): PROBNP in the last 8760 hours.  CBG: No results for input(s): GLUCAP in the last 168 hours.  No results found for this or any previous visit (from the past 240 hour(s)).   Studies: Dg Chest 2 View  Result Date: 11/25/2016 CLINICAL DATA:  Shortness of breath and sickle cell crisis EXAM: CHEST  2 VIEW COMPARISON:  08/18/2016 FINDINGS: Cardiac shadow is mildly enlarged. The lungs demonstrate some chronic changes bilaterally. No focal infiltrate or sizable effusion is seen. No bony abnormality is noted. IMPRESSION: Chronic scarring stable from the prior exam. No acute abnormality noted. Electronically Signed   By: Alcide Clever M.D.   On: 11/25/2016 10:09    Scheduled Meds: . budesonide (PULMICORT) nebulizer solution  0.5 mg Nebulization BID  . enoxaparin (LOVENOX) injection  40 mg Subcutaneous Q24H  . folic acid  1 mg Oral Daily  . HYDROmorphone   Intravenous Q6H  . ketorolac  30 mg Intravenous Q6H  . mouth rinse  15 mL Mouth Rinse BID  . senna-docusate  1 tablet Oral BID   Continuous Infusions: . sodium chloride Stopped (11/25/16 1423)  . dextrose 5 % and 0.45% NaCl Stopped (11/26/16 0532)    Principal Problem:   Sickle cell pain crisis (HCC) Active Problems:   Functional asplenia   Sickle cell disease with crisis (HCC)    In excess of 50 minutes spent during this visit. Greater than 50% involved face to face contact with the patient for assessment, counseling and coordination of care.

## 2016-11-27 NOTE — Plan of Care (Signed)
Problem: Self-Care: Goal: Ability to incorporate actions that prevent/reduce pain crisis will improve Outcome: Progressing Will continue to monitor. Pt is currently experiencing n/v . Education provided on the importance of continued hydration

## 2016-11-27 NOTE — Progress Notes (Signed)
Lunch tray ordered, pt was able to tolerate 50 cc Gatorade and 1 italian ice. Will continue to encourage

## 2016-11-28 ENCOUNTER — Encounter: Payer: Self-pay | Admitting: Internal Medicine

## 2016-11-28 DIAGNOSIS — D5701 Hb-SS disease with acute chest syndrome: Secondary | ICD-10-CM | POA: Clinically undetermined

## 2016-11-28 DIAGNOSIS — R0902 Hypoxemia: Secondary | ICD-10-CM | POA: Diagnosis present

## 2016-11-28 LAB — CBC WITH DIFFERENTIAL/PLATELET
BASOS ABS: 0 10*3/uL (ref 0.0–0.1)
Basophils Relative: 0 %
EOS ABS: 0.3 10*3/uL (ref 0.0–0.7)
Eosinophils Relative: 3 %
HEMATOCRIT: 22.5 % — AB (ref 36.0–46.0)
HEMOGLOBIN: 8 g/dL — AB (ref 12.0–15.0)
LYMPHS ABS: 2.6 10*3/uL (ref 0.7–4.0)
LYMPHS PCT: 24 %
MCH: 31.3 pg (ref 26.0–34.0)
MCHC: 35.6 g/dL (ref 30.0–36.0)
MCV: 87.9 fL (ref 78.0–100.0)
MONO ABS: 1.1 10*3/uL — AB (ref 0.1–1.0)
Monocytes Relative: 10 %
NEUTROS PCT: 63 %
Neutro Abs: 7 10*3/uL (ref 1.7–7.7)
Platelets: 273 10*3/uL (ref 150–400)
RBC: 2.56 MIL/uL — ABNORMAL LOW (ref 3.87–5.11)
RDW: 25.5 % — AB (ref 11.5–15.5)
WBC: 11 10*3/uL — AB (ref 4.0–10.5)
nRBC: 30 /100 WBC — ABNORMAL HIGH

## 2016-11-28 LAB — D-DIMER, QUANTITATIVE: D-Dimer, Quant: 3.54 ug/mL-FEU — ABNORMAL HIGH (ref 0.00–0.50)

## 2016-11-28 LAB — RETICULOCYTES: RBC.: 2.56 MIL/uL — AB (ref 3.87–5.11)

## 2016-11-28 LAB — LACTATE DEHYDROGENASE: LDH: 1075 U/L — AB (ref 98–192)

## 2016-11-28 MED ORDER — FOLIC ACID 1 MG PO TABS
2.0000 mg | ORAL_TABLET | Freq: Every day | ORAL | Status: DC
  Administered 2016-11-29: 2 mg via ORAL
  Filled 2016-11-28 (×2): qty 2

## 2016-11-28 MED ORDER — HYDROXYUREA 500 MG PO CAPS
1000.0000 mg | ORAL_CAPSULE | Freq: Every day | ORAL | Status: DC
  Administered 2016-11-28 – 2016-11-29 (×2): 1000 mg via ORAL
  Filled 2016-11-28 (×2): qty 2

## 2016-11-28 MED ORDER — NAPROXEN 250 MG PO TABS: 250.0000 mg | ORAL_TABLET | Freq: Two times a day (BID) | ORAL | 0 refills | Status: AC | PRN

## 2016-11-28 MED ORDER — KETOROLAC TROMETHAMINE 30 MG/ML IJ SOLN
30.0000 mg | Freq: Once | INTRAMUSCULAR | Status: AC
  Administered 2016-11-28: 30 mg via INTRAVENOUS
  Filled 2016-11-28: qty 1

## 2016-11-28 MED ORDER — METOCLOPRAMIDE HCL 5 MG/ML IJ SOLN
10.0000 mg | Freq: Once | INTRAMUSCULAR | Status: AC
  Administered 2016-11-28: 10 mg via INTRAVENOUS
  Filled 2016-11-28: qty 2

## 2016-11-28 MED ORDER — BUTALBITAL-APAP-CAFFEINE 50-325-40 MG PO TABS
2.0000 | ORAL_TABLET | Freq: Once | ORAL | Status: AC
  Administered 2016-11-28: 2 via ORAL
  Filled 2016-11-28: qty 2

## 2016-11-28 MED ORDER — HYDROMORPHONE HCL 2 MG PO TABS: 2.0000 mg | ORAL_TABLET | ORAL | 0 refills | Status: AC | PRN

## 2016-11-28 MED ORDER — DIPHENHYDRAMINE HCL 50 MG/ML IJ SOLN
25.0000 mg | Freq: Once | INTRAMUSCULAR | Status: AC
  Administered 2016-11-28: 25 mg via INTRAVENOUS
  Filled 2016-11-28: qty 1

## 2016-11-28 NOTE — Progress Notes (Signed)
SICKLE CELL SERVICE PROGRESS NOTE  Valerie Reid ZOX:[id number] DOB: Jan 15, 1991 DOA: 11/25/2016 PCP: Valerie Snare, MD  Assessment/Plan: Active Problems:   Functional asplenia   Hypoxia  1. Migraine Headache: Resolved. If it reoccurs I will give headache cocktail with IV Toradol and IV Compazine and pre-medicate with IV Benadryl.  2. Hb SS with Crisis. Pain resolved. Continue Dilaudid on a when necessary basis and continue Naprosyn 3. Hypoxia: Today patient is noted to be hypoxic on room air. Etiology of this is unclear. She does not feel so she is having any of her asthma symptoms and she has no wheezing occurring. She does have some shortness of breath so I will obtain a d-dimer as well as rechecking her hemoglobin today to ensure that this is not a function of the anemia. 4. Anemia of Chronic Disease: Pt was transfused 1 unit RBC. Hb now 7.6 g/dL post transfusion. Baseline Hb unknown.  Reticulocytosis robust. Increase folic acid to 2 mg. I spoke with Dr. Jolyne Reid her primary hematologist and discuss hydroxyurea. He advised that the patient should restart hydroxyurea thousand milligrams daily. She has an appointment scheduled with him for October 17 at which time he will check her labs and indices and make further decisions about her Hydrea.   Code Status: Full Code Family Communication: N/A Disposition Plan: Not yet ready for discharge  Valerie Reid A.  Pager [phone]. If 7PM-7AM, please contact night-coverage.  11/28/2016, 5:00 PM  LOS: 3 days   Interim History: Valerie Reid reports that her pain of sickle cell crisis as well as her migraines have resolved completely. She does admit to some shortness of breath with ambulation which started during this hospitalization.  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      Consultants:  None  Procedures:  None  Antibiotics:  None    Objective: Vitals:   11/28/16 1007 11/28/16 1404 11/28/16 1405 11/28/16 1507  BP:    102/64  Pulse: 98 74 79 68  Resp:    18  Temp:    99 F (37.2 C)  TempSrc:    Oral  SpO2: (!) 85% (!) 89% (!) 87% 94%  Weight:      Height:       Weight change:   Intake/Output Summary (Last 24 hours) at 11/28/16 1700 Last data filed at 11/27/16 2030  Gross per 24 hour  Intake                0 ml  Output              800 ml  Net             -800 ml      Physical Exam General: Alert, awake, oriented x3, in no apparent distress  HEENT: [place]/AT PEERL, EOMI, anicteric Neck: Supple and without stiffness or pain with AROM. Trachea midline,  no masses, no thyromegal,y no JVD, no carotid bruit OROPHARYNX:  Moist, No exudate/ erythema/lesions.  Heart: Regular rate and rhythm, without murmurs, rubs, gallops, PMI non-displaced, no heaves or thrills on palpation.  Lungs: Clear to auscultation, no wheezing or rhonchi noted. No increased vocal fremitus resonant to percussion  Abdomen: Soft, nontender, nondistended, positive bowel sounds, no masses no hepatosplenomegaly noted..  Neuro: No focal neurological deficits noted cranial nerves II through XII grossly intact. strength at functional baseline in bilateral upper and lower extremities.  Musculoskeletal: No  warmth swelling or erythema around joints, no spinal tenderness noted. Psychiatric: Patient alert and oriented x3, good insight and cognition, good recent to remote recall.    Data Reviewed: Basic Metabolic Panel:  Recent Labs Lab 11/25/16 0848 11/25/16 0901 11/26/16 0516 11/27/16 0539  NA 138 144 140 138  K 4.0 3.9 4.4 4.7  CL 106 107 107 106  CO2 24  --  26 26  GLUCOSE 124* 122* 106* 93  BUN <5* <3* <5* 7  CREATININE <0.30* 0.30* 0.38* <0.30*  CALCIUM 8.2*  --  8.4* 8.6*   Liver Function Tests:  Recent Labs Lab 11/25/16 0848 11/26/16 0516 11/27/16 0539   AST 107* 82* 96*  ALT 31 31 36  ALKPHOS 72 69 81  BILITOT 7.7* 4.8* 5.8*  PROT 7.3 7.6 7.3  ALBUMIN 3.7 3.7 3.8   No results for input(s): LIPASE, AMYLASE in the last 168 hours. No results for input(s): AMMONIA in the last 168 hours. CBC:  Recent Labs Lab 11/25/16 0848 11/25/16 0901 11/26/16 0516 11/27/16 0539  WBC 12.1*  --  10.4 10.1  NEUTROABS 7.1  --  3.2 5.9  HGB 6.4* 6.5* 6.6* 7.6*  HCT 18.0* 19.0* 18.8* 20.9*  MCV 92.8  --  92.6 88.9  PLT 289  --  299 282   Cardiac Enzymes: No results for input(s): CKTOTAL, CKMB, CKMBINDEX, TROPONINI in the last 168 hours. BNP (last 3 results) No results for input(s): BNP in the last 8760 hours.  ProBNP (last 3 results) No results for input(s): PROBNP in the last 8760 hours.  CBG: No results for input(s): GLUCAP in the last 168 hours.  No results found for this or any previous visit (from the past 240 hour(s)).   Studies: Dg Chest 2 View  Result Date: 11/25/2016 CLINICAL DATA:  Shortness of breath and sickle cell crisis EXAM: CHEST  2 VIEW COMPARISON:  08/18/2016 FINDINGS: Cardiac shadow is mildly enlarged. The lungs demonstrate some chronic changes bilaterally. No focal infiltrate or sizable effusion is seen. No bony abnormality is noted. IMPRESSION: Chronic scarring stable from the prior exam. No acute abnormality noted. Electronically Signed   By: Alcide Clever M.D.   On: 11/25/2016 10:09    Scheduled Meds: . budesonide (PULMICORT) nebulizer solution  0.5 mg Nebulization BID  . enoxaparin (LOVENOX) injection  40 mg Subcutaneous Q24H  . folic acid  1 mg Oral Daily  . hydroxyurea  1,000 mg Oral Daily  . mouth rinse  15 mL Mouth Rinse BID  . naproxen  250 mg Oral BID WC  . senna-docusate  1 tablet Oral BID   Continuous Infusions: . dextrose 5 % and 0.45% NaCl 20 mL/hr at 11/28/16 1359    Active Problems:   Functional asplenia   Hypoxia    In excess of 35 minutes spent during this visit. Greater than 50% involved  face to face contact with the patient for assessment, counseling and coordination of care.

## 2016-11-28 NOTE — Progress Notes (Signed)
CRITICAL VALUE ALERT  Critical Value: D-Dimmer 3.54  Date & Time Notied:  11/28/16 2010  Provider Notified: Merdis Delay  Orders Received/Actions taken: Awaiting for respond. Patient remain stable.

## 2016-11-28 NOTE — Progress Notes (Signed)
Patient HR -74, 02 sats 89% on RA at rest.  Patient HR - 79 and O2 sats 87% on RA after ambulation.

## 2016-11-29 ENCOUNTER — Inpatient Hospital Stay (HOSPITAL_COMMUNITY): Payer: Self-pay

## 2016-11-29 ENCOUNTER — Encounter (HOSPITAL_COMMUNITY): Payer: Self-pay

## 2016-11-29 DIAGNOSIS — D5701 Hb-SS disease with acute chest syndrome: Secondary | ICD-10-CM

## 2016-11-29 DIAGNOSIS — G43C0 Periodic headache syndromes in child or adult, not intractable: Secondary | ICD-10-CM

## 2016-11-29 DIAGNOSIS — D599 Acquired hemolytic anemia, unspecified: Secondary | ICD-10-CM

## 2016-11-29 MED ORDER — IOPAMIDOL (ISOVUE-370) INJECTION 76%
100.0000 mL | Freq: Once | INTRAVENOUS | Status: AC | PRN
  Administered 2016-11-29: 80 mL via INTRAVENOUS

## 2016-11-29 MED ORDER — KETOROLAC TROMETHAMINE 30 MG/ML IJ SOLN
15.0000 mg | Freq: Once | INTRAMUSCULAR | Status: AC
  Administered 2016-11-29: 15 mg via INTRAVENOUS
  Filled 2016-11-29: qty 1

## 2016-11-29 MED ORDER — IOPAMIDOL (ISOVUE-370) INJECTION 76%
INTRAVENOUS | Status: AC
  Administered 2016-11-29: 80 mL via INTRAVENOUS
  Filled 2016-11-29: qty 100

## 2016-11-29 MED ORDER — PROCHLORPERAZINE EDISYLATE 5 MG/ML IJ SOLN
10.0000 mg | Freq: Once | INTRAMUSCULAR | Status: AC
  Administered 2016-11-29: 10 mg via INTRAVENOUS
  Filled 2016-11-29: qty 2

## 2016-11-29 MED ORDER — DIPHENHYDRAMINE HCL 50 MG/ML IJ SOLN
12.5000 mg | Freq: Once | INTRAMUSCULAR | Status: AC
  Administered 2016-11-29: 12.5 mg via INTRAVENOUS
  Filled 2016-11-29: qty 1

## 2016-11-29 MED ORDER — HYDROXYUREA 500 MG PO CAPS: 1000.0000 mg | ORAL_CAPSULE | Freq: Every day | ORAL | 0 refills | Status: AC

## 2016-11-29 NOTE — Discharge Summary (Signed)
Valerie Reid MRN: [id number] DOB/AGE: 1990-08-13 26 y.o.  Admit date: 11/25/2016 Discharge date: 11/29/2016  Primary Care Physician:  Ledon Snare, MD   Discharge Diagnoses:   Patient Active Problem List   Diagnosis Date Noted  . Anemia of chronic disease 08/19/2015  . Airway hyperreactivity 03/27/2015  . FO (foramen ovale) 01/01/2013  . 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   folic acid 1 MG tablet Commonly known as:  FOLVITE Take 1 mg by mouth daily.   HYDROmorphone 2 MG tablet Commonly known as:  DILAUDID Take 1 tablet (2 mg total) by mouth every 4 (four) hours as needed for moderate pain or severe pain (except fro HA).   hydroxyurea 500 MG capsule Commonly known as:  HYDREA Take 2 capsules (1,000 mg total) by mouth daily. May take with food to minimize GI side effects. What changed:  how much to take   naproxen 250 MG tablet Commonly known as:  NAPROSYN Take 1 tablet (250 mg total) by mouth 2 (two) times daily as needed for headache.            Discharge Care Instructions        Start     Ordered   11/29/16 0000  hydroxyurea (HYDREA) 500 MG capsule  Daily     11/29/16 1447   11/29/16 0000  Activity as tolerated - No restrictions     11/29/16 1447   11/29/16 0000  Diet general     11/29/16 1447   11/28/16 0000  naproxen (NAPROSYN) 250 MG tablet  2 times daily PRN     11/28/16 1236   11/28/16 0000  HYDROmorphone  (DILAUDID) 2 MG tablet  Every 4 hours PRN     11/28/16 1236        Consults:    SIGNIFICANT DIAGNOSTIC STUDIES:  Dg Chest 2 View  Result Date: 11/25/2016 CLINICAL DATA:  Shortness of breath and sickle cell crisis EXAM: CHEST  2 VIEW COMPARISON:  08/18/2016 FINDINGS: Cardiac shadow is mildly enlarged. The lungs demonstrate some chronic changes bilaterally. No focal infiltrate or sizable effusion is seen. No bony abnormality is noted. IMPRESSION: Chronic scarring stable from the prior exam. No acute abnormality noted. Electronically Signed   By: Alcide Clever M.D.   On: 11/25/2016 10:09   Ct Angio Chest Pe W Or Wo Contrast  Result Date: 11/29/2016 CLINICAL DATA:  PE suspected, intermediate prob, positive D-dimer. Sickle cell crisis. Shortness of breath and hypoxia. Elevated D-dimer. EXAM: CT ANGIOGRAPHY CHEST WITH CONTRAST TECHNIQUE: Multidetector CT imaging of the chest was performed using the standard protocol during bolus administration of intravenous contrast. Multiplanar CT image reconstructions and MIPs were obtained to evaluate the vascular anatomy. CONTRAST:  80 cc Isovue 370 IV COMPARISON:  Radiographs 11/25/2016.  Chest CT 08/19/2015 FINDINGS: Cardiovascular: There are no filling defects within the pulmonary arteries to suggest pulmonary  embolus. Normal caliber thoracic aorta without evidence of dissection. Mild-to-moderate cardiomegaly, progressed from prior CT. No pericardial effusion. Mediastinum/Nodes: Prominent right hilar nodes, largest measuring 11 mm. These are similar to prior. Decreased size of left hilar nodes from prior exam. The esophagus is decompressed. Visualized thyroid gland is normal. Lungs/Pleura: Streaky and linear upper lobe scarring, unchanged from prior heterogeneous attenuation of lung parenchyma. Probable focal consolidation in the posterior basal right lower lobe, motion artifact obscured. Trace right pleural effusion. Upper Abdomen: Atrophic spleen.  No acute  abnormality. Musculoskeletal: Endplate changes in the thoracic spine consistent with sickle cell. 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 No acute abnormality. Review of the MIP images confirms the above findings. IMPRESSION: 1. No pulmonary embolus. 2. Probable right lower lobe consolidation, suspicious for acute chest syndrome in sickle cell, detailed evaluation motion obscured. Tiny right pleural effusion. 3. Multifocal lung scarring. Heterogeneous attenuation of lung parenchyma likely secondary to chronic small vessel disease. 4. Cardiomegaly. 5. Prominent right hilar nodes are nonspecific but likely reactive. Electronically Signed   By: Rubye Oaks M.D.   On: 11/29/2016 06:47      No results found for this or any previous visit (from the past 240 hour(s)).  BRIEF ADMITTING H & P: Patient is a 26 year old female with history of sickle cell disease who is relatively opiate nave but also has history of asthma.  She came to the ER with low back pain and leg pain rated as 10 out of 10.  She took her medications at home without relief.  In the ER she has received multiple doses of Dilaudid but pain has persisted.  She is unable to function as she normally does.   Patient was subsequently admitted for treatment of sickle cell painful crisis.  Patient's pain was not relieved by anything.   After admission patient was noted to be hypoxic with oxygen sats in the 80s but no obvious infiltrate and no acute chest syndrome.  She has history of asthma but this only mildly wheezing.    Hospital Course:  Present on Admission: . (Resolved) Sickle cell pain crisis (HCC) . (Resolved) Sickle cell disease with crisis (HCC) . (Resolved) Migraine . (Resolved) Hypoxia  Valerie Reid is an opiate naive patient with Hb SS who was admitted with sickle cell crisis. She was started on Dilaudid PCA, Toradol and IVF. She had very little use on the PCA and when I saw her about 36 hours into her admission, her c/o was a  migraine and she had no significant pain associated with Sickle Cell Crisis. A review of her records showed evidence of a previous CVA thus the usual HA medications were avoided. She was treated with a HA cocktail of IV Toradol, IV Compazine and IV Benadryl. She responded well this and had partial resolution of the HA shortly after receiving the medication. The HA was further treated with Naprosyn and the Toradol was discontinued. Ultimately the HA resolved.   Pt was also noted to have Hb of 6.4 g/dL which is lower that her usual 8 g/dL. She also had laboratory evidence of acute hemolysis with elevated bilirubin. She was transfused 1 unit rbc's and at the time of discharge Hb was stable at 8 g/dL. She had been on Hydrea 1500 mg daily but has been without Hydrea for about 4 months. I spoke with her Hematologist- Dr. Trinna Balloon and I have restarted Hydrea at 1000 mg daily. Reticulocytes at the time of discharge is >23%.   On admission, she  was also noted to have hypoxia without a syndrome of acute infection or an exacerbation of Asthma. Tfe admitting Physician  Felt that this was associated with the anemia and transfused 1 unit of RBC's. However the hypoxia persisted. A d-dimer was found to be elevated and CTA was ordered which showed findings consistent with Acute Chest Syndrome. She was given supportive care and aggressive use of the Incentive Spirometer and her oxygenation improved as did the HR. At the time of discharge she was ambulating without need for Oxygen and HR was 70-84 BPM with ambulation.    Disposition and Follow-up: Pt is discharged home in good condition and is to follow up with Dr. Jolyne Loa on 12/20/2016. At that time he will obtain Hydrea surveillance labs.  Discharge Instructions    Activity as tolerated - No restrictions    Complete by:  As directed    Diet general    Complete by:  As directed       DISCHARGE EXAM:  General: Alert, awake, oriented x3, in no apparent distress.   HEENT: /AT PEERL, EOMI, anicteric, mild icterus Neck: Trachea midline, no masses, no thyromegal,y no JVD, no carotid bruit OROPHARYNX: Moist, No exudate/ erythema/lesions.  Heart: Regular rate and rhythm, without murmurs, rubs, gallops or S3. PMI non-displaced. Exam reveals no decreased pulses. Pulmonary/Chest: Normal effort. Breath sounds normal. No. Apnea. Clear to auscultation,no stridor,  no wheezing and no rhonchi noted. No respiratory distress and no tenderness noted. Abdomen: Soft, nontender, nondistended, normal bowel sounds, no masses no hepatosplenomegaly noted. No fluid wave and no ascites. There is no guarding or rebound. Neuro: Alert and oriented to person, place and time. Normal motor skills, Displays no atrophy or tremors and exhibits normal muscle tone.  No focal neurological deficits noted cranial nerves II through XII grossly intact. No sensory deficit noted. Strength at baseline in bilateral upper and lower extremities. Gait normal. Musculoskeletal: No warmth swelling or erythema around joints, no spinal tenderness noted. Psychiatric: Patient alert and oriented x3, good insight and cognition, good recent to remote recall. Lymph node survey: No cervical axillary or inguinal lymphadenopathy noted. Skin: Skin is warm and dry. No bruising, no ecchymosis and no rash noted. Pt is not diaphoretic. No erythema. No pallor    Blood pressure 105/78, pulse 85, temperature 98.8 F (37.1 C), temperature source Oral, resp. rate 16, height  (1.626 m), weight 52 kg (114 lb 9.6 oz), last menstrual period 10/21/2016, SpO2 94 %.   Recent Labs  11/27/16 0539  NA 138  K 4.7  CL 106  CO2 26  GLUCOSE 93  BUN 7  CREATININE <0.30*  CALCIUM 8.6*    Recent Labs  11/27/16 0539  AST 96*  ALT 36  ALKPHOS 81  BILITOT 5.8*  PROT 7.3  ALBUMIN 3.8   No results for input(s): LIPASE, AMYLASE in the last 72 hours.  Recent Labs  11/27/16 0539 11/28/16 1757  WBC 10.1 11.0*   NEUTROABS 5.9 7.0  HGB 7.6* 8.0*  HCT 20.9* 22.5*  MCV 88.9 87.9  PLT 282 273     Total time spent including face to face and decision making was greater than 30 minutes  Signed: MATTHEWS,MICHELLE A. 11/29/2016, 3:02 PM

## 2016-11-29 NOTE — Progress Notes (Signed)
Pt discharged home. Education complete, no questions/concerns from pt. All personal items returned including pt's home meds.

## 2016-11-29 NOTE — Progress Notes (Signed)
Pt maintained 92% o2 sat while ambulating, on RA.

## 2016-11-29 NOTE — Progress Notes (Signed)
While ambulating

## 2016-11-29 NOTE — Discharge Instructions (Signed)
Sickle Cell Anemia, Adult °Sickle cell anemia is a condition in which red blood cells have an abnormal “sickle” shape. This abnormal shape shortens the cells’ life span, which results in a lower than normal concentration of red blood cells in the blood. The sickle shape also causes the cells to clump together and block free blood flow through the blood vessels. As a result, the tissues and organs of the body do not receive enough oxygen. Sickle cell anemia causes organ damage and pain and increases the risk of infection. °What are the causes? °Sickle cell anemia is a genetic disorder. Those who receive two copies of the gene have the condition, and those who receive one copy have the trait. °What increases the risk? °The sickle cell gene is most common in people whose families originated in [place]. Other areas of the globe where sickle cell trait occurs include the [place], [place] and [place], the [place], and the [place]. °What are the signs or symptoms? °· Pain, especially in the extremities, back, chest, or abdomen (common). The pain may start suddenly or may develop following an illness, especially if there is dehydration. Pain can also occur due to overexertion or exposure to extreme temperature changes. °· Frequent severe bacterial infections, especially certain types of pneumonia and meningitis. °· Pain and swelling in the hands and feet. °· Decreased activity. °· Loss of appetite. °· Change in behavior. °· Headaches. °· Seizures. °· Shortness of breath or difficulty breathing. °· Vision changes. °· Skin ulcers. °Those with the trait may not have symptoms or they may have mild symptoms. °How is this diagnosed? °Sickle cell anemia is diagnosed with blood tests that demonstrate the genetic trait. It is often diagnosed during the newborn period, due to mandatory testing nationwide. A variety of blood tests, X-rays, CT scans, MRI scans, ultrasounds, and lung function tests may also be done to  monitor the condition. °How is this treated? °Sickle cell anemia may be treated with: °· Medicines. You may be given pain medicines, antibiotic medicines (to treat and prevent infections) or medicines to increase the production of certain types of hemoglobin. °· Fluids. °· Oxygen. °· Blood transfusions. ° °Follow these instructions at home: °· Drink enough fluid to keep your urine clear or pale yellow. Increase your fluid intake in hot weather and during exercise. °· Do not smoke. Smoking lowers oxygen levels in the blood. °· Only take over-the-counter or prescription medicines for pain, fever, or discomfort as directed by your health care provider. °· Take antibiotics as directed by your health care provider. Make sure you finish them it even if you start to feel better. °· Take supplements as directed by your health care provider. °· Consider wearing a medical alert bracelet. This tells anyone caring for you in an emergency of your condition. °· When traveling, keep your medical information, health care provider's names, and the medicines you take with you at all times. °· If you develop a fever, do not take medicines to reduce the fever right away. This could cover up a problem that is developing. Notify your health care provider. °· Keep all follow-up appointments with your health care provider. Sickle cell anemia requires regular medical care. °Contact a health care provider if: °You have a fever. °Get help right away if: °· You feel dizzy or faint. °· You have new abdominal pain, especially on the left side near the stomach area. °· You develop a persistent, often uncomfortable and painful penile erection (priapism). If this is not   treated immediately it will lead to impotence. You have numbness your arms or legs or you have a hard time moving them.    You have a hard time with speech.  You have a fever or persistent symptoms for more than 2-3 days.  You have a fever and your symptoms suddenly get  worse.  You have signs or symptoms of infection. These include: ? Chills. ? Abnormal tiredness (lethargy). ? Irritability. ? Poor eating. ? Vomiting.  You develop pain that is not helped with medicine.  You develop shortness of breath.  You have pain in your chest.  You are coughing up pus-like or bloody sputum.  You develop a stiff neck.  Your feet or hands swell or have pain.  Your abdomen appears bloated.  You develop joint pain. This information is not intended to replace advice given to you by your health care provider. Make sure you discuss any questions you have with your health care provider. Document Released: 05/31/2005 Document Revised: 09/10/2015 Document Reviewed: 10/02/2012 Elsevier Interactive Patient Education  2017 [employer]  Acute Chest Syndrome  Acute chest syndrome is one of the most serious problems that people with sickle cell disease (SCD) can have. If it happens to you, youll need medical attention right away, before it becomes life-threatening. The symptoms include chest pain, fever, and breathing problems. But doctors can treat it if its found early. If you cant see your doctor right away, go to the emergency room . Although there is no one condition that causes acute chest syndrome, another illness can trigger it. It could be a lung infection, such as pneumonia, or a pulmonary embolism. Asthma is a top cause of acute chest syndrome in children who have SCD. In the [place], most people with SCD are African-American. Asthma is also common in African-American children, affecting about 15% to 20%. Those two conditions together can be risky. Symptoms You may have a cough or be short of breath. You could also be wheezing heavily. These symptoms, while very uncomfortable, may seem like a cold or another breathing problem.  Acute chest syndrome shows up in about one-third of people with SCD. If you get it once, you have an 80% chance of getting it  again.  Because the symptoms could be caused by other sicknesses, many people dont see a doctor right away. Also, these symptoms wont always raise a red flag with your doctor unless she knows you have SCD.

## 2016-11-29 NOTE — Care Management Note (Signed)
Case Management Note  Patient Details  Name: Valerie Reid MRN: [id number] Date of Birth: [birth year]  Subjective/Objective:     26 yo admitted with Sickle Cell Crisis and Functional Asplenia.               Action/Plan: From home with significant other. Pt is without insurance and has been seen by financial counselor this admission. She has PCP Dr. Jolyne Loa in [facility]  Expected Discharge Date:  11/28/16               Expected Discharge Plan:  Home/Self Care  In-House Referral:     Discharge planning Services  CM Consult  Post Acute Care Choice:    Choice offered to:     DME Arranged:    DME Agency:     HH Arranged:    HH Agency:     Status of Service:  In process, will continue to follow  If discussed at Long Length of Stay Meetings, dates discussed:    Additional CommentsBartholome Bill, RN 11/29/2016, 11:05 AM  [phone]

## 2017-03-13 ENCOUNTER — Emergency Department (HOSPITAL_COMMUNITY)
Admission: EM | Admit: 2017-03-13 | Discharge: 2017-03-13 | Discharge status: Against Medical Advice | Payer: Self-pay | Attending: Emergency Medicine | Admitting: Emergency Medicine

## 2017-03-13 ENCOUNTER — Encounter (HOSPITAL_COMMUNITY): Payer: Self-pay | Admitting: Emergency Medicine

## 2017-03-13 DIAGNOSIS — Z5321 Procedure and treatment not carried out due to patient leaving prior to being seen by health care provider: Secondary | ICD-10-CM | POA: Insufficient documentation

## 2017-03-13 MED ORDER — HYDROMORPHONE HCL 1 MG/ML IJ SOLN: 0.5000 mg | Freq: Once | INTRAMUSCULAR | Status: DC

## 2017-03-13 NOTE — ED Notes (Signed)
Pt called from the lobby with no response x2 

## 2017-03-13 NOTE — ED Triage Notes (Signed)
Patient c/o sickle cell pain that has been generalized whole body intermittently over the past several days. Patient reports that she takes Dilaudid at home but out.

## 2017-03-13 NOTE — ED Notes (Signed)
Patient no answer from lobby

## 2017-04-22 ENCOUNTER — Emergency Department (HOSPITAL_COMMUNITY)
Admission: EM | Admit: 2017-04-22 | Discharge: 2017-04-22 | Disposition: A | Discharge status: Home / Self Care | Payer: Self-pay | Attending: Emergency Medicine | Admitting: Emergency Medicine

## 2017-04-22 ENCOUNTER — Encounter (HOSPITAL_COMMUNITY): Payer: Self-pay | Admitting: Emergency Medicine

## 2017-04-22 ENCOUNTER — Other Ambulatory Visit: Payer: Self-pay

## 2017-04-22 DIAGNOSIS — B373 Candidiasis of vulva and vagina: Secondary | ICD-10-CM | POA: Insufficient documentation

## 2017-04-22 DIAGNOSIS — N3000 Acute cystitis without hematuria: Secondary | ICD-10-CM | POA: Insufficient documentation

## 2017-04-22 LAB — URINALYSIS, ROUTINE W REFLEX MICROSCOPIC
BILIRUBIN URINE: NEGATIVE
Glucose, UA: NEGATIVE mg/dL
Ketones, ur: NEGATIVE mg/dL
NITRITE: POSITIVE — AB
PROTEIN: NEGATIVE mg/dL
Specific Gravity, Urine: 1.01 (ref 1.005–1.030)
pH: 6 (ref 5.0–8.0)

## 2017-04-22 LAB — COMPREHENSIVE METABOLIC PANEL
ALT: 18 U/L (ref 14–54)
ANION GAP: 8 (ref 5–15)
AST: 62 U/L — ABNORMAL HIGH (ref 15–41)
Albumin: 4.5 g/dL (ref 3.5–5.0)
Alkaline Phosphatase: 81 U/L (ref 38–126)
BILIRUBIN TOTAL: 4.2 mg/dL — AB (ref 0.3–1.2)
BUN: 8 mg/dL (ref 6–20)
CHLORIDE: 108 mmol/L (ref 101–111)
CO2: 22 mmol/L (ref 22–32)
Calcium: 9 mg/dL (ref 8.9–10.3)
Creatinine, Ser: 0.31 mg/dL — ABNORMAL LOW (ref 0.44–1.00)
GFR calc Af Amer: >60 mL/min (ref >60)
GFR calc non Af Amer: >60 mL/min (ref >60)
GLUCOSE: 104 mg/dL — AB (ref 65–99)
POTASSIUM: 4 mmol/L (ref 3.5–5.1)
SODIUM: 138 mmol/L (ref 135–145)
TOTAL PROTEIN: 8.8 g/dL — AB (ref 6.5–8.1)

## 2017-04-22 LAB — CBC
HEMATOCRIT: 22.1 % — AB (ref 36.0–46.0)
HEMOGLOBIN: 7.4 g/dL — AB (ref 12.0–15.0)
MCH: 30.2 pg (ref 26.0–34.0)
MCHC: 33.5 g/dL (ref 30.0–36.0)
MCV: 90.2 fL (ref 78.0–100.0)
Platelets: 341 10*3/uL (ref 150–400)
RBC: 2.45 MIL/uL — ABNORMAL LOW (ref 3.87–5.11)
RDW: 27 % — ABNORMAL HIGH (ref 11.5–15.5)
WBC: 15.7 10*3/uL — AB (ref 4.0–10.5)

## 2017-04-22 LAB — POC URINE PREG, ED: PREG TEST UR: NEGATIVE

## 2017-04-22 LAB — WET PREP, GENITAL
Sperm: NONE SEEN
TRICH WET PREP: NONE SEEN

## 2017-04-22 LAB — LIPASE, BLOOD: LIPASE: 23 U/L (ref 11–51)

## 2017-04-22 MED ORDER — ONDANSETRON 4 MG PO TBDP
4.0000 mg | ORAL_TABLET | Freq: Once | ORAL | Status: AC | PRN
  Administered 2017-04-22: 4 mg via ORAL
  Filled 2017-04-22: qty 1

## 2017-04-22 MED ORDER — CEPHALEXIN 500 MG PO CAPS: ORAL_CAPSULE | ORAL | 0 refills | Status: DC

## 2017-04-22 MED ORDER — HYDROMORPHONE HCL 1 MG/ML IJ SOLN
1.0000 mg | Freq: Once | INTRAMUSCULAR | Status: AC
  Administered 2017-04-22: 1 mg via INTRAVENOUS
  Filled 2017-04-22: qty 1

## 2017-04-22 MED ORDER — KETOROLAC TROMETHAMINE 30 MG/ML IJ SOLN
30.0000 mg | Freq: Once | INTRAMUSCULAR | Status: AC
  Administered 2017-04-22: 30 mg via INTRAVENOUS
  Filled 2017-04-22: qty 1

## 2017-04-22 MED ORDER — FLUCONAZOLE 150 MG PO TABS
150.0000 mg | ORAL_TABLET | Freq: Once | ORAL | Status: AC
  Administered 2017-04-22: 150 mg via ORAL
  Filled 2017-04-22: qty 1

## 2017-04-22 MED ORDER — SODIUM CHLORIDE 0.9 % IV SOLN
1.0000 g | Freq: Once | INTRAVENOUS | Status: AC
  Administered 2017-04-22: 1 g via INTRAVENOUS
  Filled 2017-04-22: qty 10

## 2017-04-22 MED ORDER — ONDANSETRON HCL 4 MG/2ML IJ SOLN
4.0000 mg | Freq: Once | INTRAMUSCULAR | Status: AC
  Administered 2017-04-22: 4 mg via INTRAVENOUS
  Filled 2017-04-22: qty 2

## 2017-04-22 MED ORDER — SODIUM CHLORIDE 0.45 % IV BOLUS
1000.0000 mL | Freq: Once | INTRAVENOUS | Status: AC
Start: 2017-04-22 — End: 2017-04-22
  Administered 2017-04-22: 1000 mL via INTRAVENOUS

## 2017-04-22 NOTE — ED Notes (Addendum)
Attempted to draw labs x2 with no success. Triage RN made aware.

## 2017-04-22 NOTE — ED Triage Notes (Signed)
Pt reports having lower abd pain that started suddenly. Pt states that she had urinated and then the pain started. Denies any pain or burning with urination. Pt reports that pain is in suprapubic area.

## 2017-04-22 NOTE — Discharge Instructions (Signed)
You have been seen today for your complaint of pain with urination. Your lab work showed urine infection. Your discharge medications include: 1) keflex Please take all of your antibiotics until finished!   You may develop abdominal discomfort or diarrhea from the antibiotic.  You may help offset this with probiotics which you can buy or get in yogurt. Do not eat  or take the probiotics until 2 hours after your antibiotic.   Home care instructions are as follows:  1) please drink plenty of water, avoid tea and beverages with caffeine like coffee or soda 2) if you are sexually active, ,make sure to urinate immediately after intercourse Follow up with: your doctor or the emergency department Please seek immediate medical care if you develop any of the following symptoms: SEEK MEDICAL CARE IF:  You have back pain.  You develop a fever.  Your symptoms do not begin to resolve within 3 days.  SEEK IMMEDIATE MEDICAL CARE IF:  You have severe back pain or lower abdominal pain.  You develop chills.  You have nausea or vomiting.  You have continued burning or discomfort with urination.

## 2017-04-22 NOTE — ED Provider Notes (Signed)
[facility]-EMERGENCY DEPT Provider Note   CSN: [id number] Arrival date & time: 04/22/17  0056     History   Chief Complaint Chief Complaint  Patient presents with  . Abdominal Pain    HPI Valerie Reid is a 27 y.o. female with a PMH of SS anemia. She is followed at [facility]. The patietn c/o lower suprapubic pain. She had onset of pain around midday. She c/o moderate to severe pain. It is unlike her pain crisis. She denies urinary sxs, vaginal sxs. N/v/d. LMP 04/01/2017. She states that it   HPI  Past Medical History:  Diagnosis Date  . Asthma   . Sickle cell anemia [facility])     Patient Active Problem List   Diagnosis Date Noted  . Anemia of chronic disease 08/19/2015  . Airway hyperreactivity 03/27/2015  . FO (foramen ovale) 01/01/2013  . 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 CHOLECYSTECTOMY    . INDUCED ABORTION N/A   . port-a-cath insertion     . PORT-A-CATH REMOVAL      OB History    No data available       Home Medications    Prior to Admission medications   Medication Sig Start Date End Date Taking? Authorizing Provider  fluticasone (FLOVENT HFA) 110 MCG/ACT inhaler Inhale 2 puffs into the lungs 2 (two) times daily.   Yes [provider]  folic acid (FOLVITE) 1 MG tablet Take 1 mg by mouth daily.   Yes [provider]  HYDROmorphone (DILAUDID) 2 MG tablet Take 1 tablet (2 mg total) by mouth every 4 (four) hours as needed for moderate pain or severe pain (except fro HA). 11/28/16  Yes Altha Harm, MD  hydroxyurea (HYDREA) 500 MG capsule Take 2 capsules (1,000 mg total) by mouth daily. May take with food to minimize GI side effects. 11/29/16  Yes Altha Harm, MD  naproxen (NAPROSYN) 250 MG tablet Take 1 tablet (250 mg total) by mouth 2 (two) times daily as needed for headache. 11/28/16   Yes Altha Harm, MD  cephALEXin (KEFLEX) 500 MG capsule 2 caps po bid x 7 days 04/22/17   Arthor Captain, PA-C    Family History Family History  Problem Relation Age of Onset  . Sickle cell anemia Sister   . Sickle cell trait Mother   . Sickle cell trait Father     Social History Social History   Tobacco Use  . Smoking status: Never Smoker  . Smokeless tobacco: Never Used  Substance Use Topics  . Alcohol use: No  . Drug use: No     Allergies   Excedrin migraine [asa-apap-caff buffered]; Fioricet [butalbital-apap-caffeine]; and Imitrex [sumatriptan]   Review of Systems Review of Systems   Physical Exam Updated Vital Signs BP (!) 92/46 (BP Location: Left Arm)   Pulse 61   Temp 98.4 F (36.9 C) (Oral)   Resp 18   Ht 5\' 2"  (1.575 m)   Wt 47.6 kg (105 lb)   LMP 04/01/2017   SpO2 99%   BMI 19.20 kg/m   Physical Exam  Constitutional: She is oriented to person, place, and time. She appears well-developed and well-nourished. No distress.  HENT:  Head: Normocephalic and atraumatic.  Eyes: Conjunctivae are normal. No scleral icterus.  Neck: Normal range of motion.  Cardiovascular: Normal rate, regular rhythm and normal heart sounds. Exam reveals no  gallop and no friction rub.  No murmur heard. Pulmonary/Chest: Effort normal and breath sounds normal. No respiratory distress.  Abdominal: Soft. Bowel sounds are normal. She exhibits no distension and no mass. There is tenderness in the suprapubic area. There is no guarding.  Genitourinary:  Genitourinary Comments: Pelvic exam: normal external genitalia, vulva, vagina, cervix, uterus and adnexa.   Neurological: She is alert and oriented to person, place, and time.  Skin: Skin is warm and dry. She is not diaphoretic.  Psychiatric: Her behavior is normal.  Nursing note and vitals reviewed.    ED Treatments / Results  Labs (all labs ordered are listed, but only abnormal results are displayed) Labs Reviewed    WET PREP, GENITAL - Abnormal; Notable for the following components:      Result Value   Yeast Wet Prep HPF POC PRESENT (*)    Clue Cells Wet Prep HPF POC PRESENT (*)    WBC, Wet Prep HPF POC RARE (*)    All other components within normal limits  COMPREHENSIVE METABOLIC PANEL - Abnormal; Notable for the following components:   Glucose, Bld 104 (*)    Creatinine, Ser 0.31 (*)    Total Protein 8.8 (*)    AST 62 (*)    Total Bilirubin 4.2 (*)    All other components within normal limits  CBC - Abnormal; Notable for the following components:   WBC 15.7 (*)    RBC 2.45 (*)    Hemoglobin 7.4 (*)    HCT 22.1 (*)    RDW 27.0 (*)    All other components within normal limits  URINALYSIS, ROUTINE W REFLEX MICROSCOPIC - Abnormal; Notable for the following components:   Hgb urine dipstick SMALL (*)    Nitrite POSITIVE (*)    Leukocytes, UA SMALL (*)    Bacteria, UA MANY (*)    Squamous Epithelial / LPF 0-5 (*)    All other components within normal limits  URINE CULTURE  LIPASE, BLOOD  POC URINE PREG, ED  GC/CHLAMYDIA PROBE AMP ([place]) NOT AT [facility]    EKG  EKG Interpretation None       Radiology No results found.  Procedures Procedures (including critical care time)  Medications Ordered in ED Medications  fluconazole (DIFLUCAN) tablet 150 mg (not administered)  ondansetron (ZOFRAN-ODT) disintegrating tablet 4 mg (4 mg Oral Given 04/22/17 0141)  HYDROmorphone (DILAUDID) injection 1 mg (1 mg Intravenous Given 04/22/17 0423)  ondansetron (ZOFRAN) injection 4 mg (4 mg Intravenous Given 04/22/17 0420)  ketorolac (TORADOL) 30 MG/ML injection 30 mg (30 mg Intravenous Given 04/22/17 0422)  sodium chloride 0.45 % bolus 1,000 mL (1,000 mLs Intravenous New Bag/Given 04/22/17 0420)  cefTRIAXone (ROCEPHIN) 1 g in sodium chloride 0.9 % 100 mL IVPB (1 g Intravenous New Bag/Given 04/22/17 0555)     Initial Impression / Assessment and Plan / ED Course  I have reviewed the triage vital  signs and the nursing notes.  Pertinent labs & imaging results that were available during my care of the patient were reviewed by me and considered in my medical decision making (see chart for details).  Clinical Course as of Apr 22 653  Wynelle Link Apr 22, 2017  1610 Patient a[ppears to have a UTI. Her pelvic exam was benign.  [AH]    Clinical Course User Index [AH] Arthor Captain, PA-C   Patient with urinary tract infection.  She has been treated here with Rocephin patient be discharged with Keflex.  Wet prep positive for  yeast and given a dose of oral Diflucan.  I have given the patient return precautions.  She appears appropriate for discharge   Final Clinical Impressions(s) / ED Diagnoses   Final diagnoses:  Acute cystitis without hematuria    ED Discharge Orders        Ordered    cephALEXin (KEFLEX) 500 MG capsule     04/22/17 0644       Arthor CaptainHarris, Aylanie Cubillos, PA-C 04/22/17 [id number]    Dione BoozeGlick, David, MD 04/22/17 [phone]

## 2017-04-23 LAB — GC/CHLAMYDIA PROBE AMP (~~LOC~~) NOT AT ARMC
Chlamydia: NEGATIVE
NEISSERIA GONORRHEA: NEGATIVE

## 2017-04-24 LAB — URINE CULTURE: Culture: >=100000 — AB

## 2017-04-25 ENCOUNTER — Telehealth: Payer: Self-pay | Admitting: Emergency Medicine

## 2017-04-25 NOTE — Telephone Encounter (Signed)
Post ED Visit - Positive Culture Follow-up  Culture report reviewed by antimicrobial stewardship pharmacist:  [x]  Enzo BiNathan Batchelder, Pharm.D. []  Celedonio MiyamotoJeremy Frens, Pharm.D., BCPS AQ-ID []  Garvin FilaMike Maccia, Pharm.D., BCPS []  Georgina PillionElizabeth Martin, [address] BCPS []  GeneseoMinh Pham, [address]Ph[address]D., BCPS, AAHIVP []  Estella HuskMichelle Turner, Pharm.D., BCPS, AAHIVP []  Lysle Pearlachel Rumbarger, PharmD, BCPS []  Blake DivineShannon Parkey, PharmD []  Pollyann SamplesAndy Johnston, PharmD, BCPS  Positive urine culture Treated with cephalexin, organism sensitive to the same and no further patient follow-up is required at this time.  Berle MullMiller, Coral Timme 04/25/2017, 9:40 AM

## 2017-05-10 ENCOUNTER — Other Ambulatory Visit: Payer: Self-pay

## 2017-05-10 ENCOUNTER — Inpatient Hospital Stay (HOSPITAL_COMMUNITY)
Admission: EM | Admit: 2017-05-10 | Discharge: 2017-05-16 | DRG: 193 | Disposition: A | Discharge status: Home / Self Care | Payer: Self-pay | Attending: Internal Medicine | Admitting: Internal Medicine

## 2017-05-10 ENCOUNTER — Emergency Department (HOSPITAL_COMMUNITY): Payer: Self-pay

## 2017-05-10 ENCOUNTER — Encounter (HOSPITAL_COMMUNITY): Payer: Self-pay

## 2017-05-10 DIAGNOSIS — I959 Hypotension, unspecified: Secondary | ICD-10-CM | POA: Diagnosis present

## 2017-05-10 DIAGNOSIS — J45909 Unspecified asthma, uncomplicated: Secondary | ICD-10-CM | POA: Diagnosis present

## 2017-05-10 DIAGNOSIS — R51 Headache: Secondary | ICD-10-CM

## 2017-05-10 DIAGNOSIS — R7989 Other specified abnormal findings of blood chemistry: Secondary | ICD-10-CM

## 2017-05-10 DIAGNOSIS — Z832 Family history of diseases of the blood and blood-forming organs and certain disorders involving the immune mechanism: Secondary | ICD-10-CM

## 2017-05-10 DIAGNOSIS — Q8901 Asplenia (congenital): Secondary | ICD-10-CM

## 2017-05-10 DIAGNOSIS — J101 Influenza due to other identified influenza virus with other respiratory manifestations: Principal | ICD-10-CM | POA: Diagnosis present

## 2017-05-10 DIAGNOSIS — Z886 Allergy status to analgesic agent status: Secondary | ICD-10-CM

## 2017-05-10 DIAGNOSIS — G43909 Migraine, unspecified, not intractable, without status migrainosus: Secondary | ICD-10-CM | POA: Diagnosis present

## 2017-05-10 DIAGNOSIS — Z79899 Other long term (current) drug therapy: Secondary | ICD-10-CM

## 2017-05-10 DIAGNOSIS — D5700 Hb-SS disease with crisis, unspecified: Secondary | ICD-10-CM | POA: Diagnosis present

## 2017-05-10 DIAGNOSIS — R6510 Systemic inflammatory response syndrome (SIRS) of non-infectious origin without acute organ dysfunction: Secondary | ICD-10-CM | POA: Diagnosis present

## 2017-05-10 DIAGNOSIS — D599 Acquired hemolytic anemia, unspecified: Secondary | ICD-10-CM | POA: Diagnosis present

## 2017-05-10 DIAGNOSIS — K5900 Constipation, unspecified: Secondary | ICD-10-CM | POA: Diagnosis present

## 2017-05-10 DIAGNOSIS — R1110 Vomiting, unspecified: Secondary | ICD-10-CM | POA: Diagnosis not present

## 2017-05-10 DIAGNOSIS — Z888 Allergy status to other drugs, medicaments and biological substances status: Secondary | ICD-10-CM

## 2017-05-10 DIAGNOSIS — R778 Other specified abnormalities of plasma proteins: Secondary | ICD-10-CM | POA: Diagnosis not present

## 2017-05-10 DIAGNOSIS — I248 Other forms of acute ischemic heart disease: Secondary | ICD-10-CM | POA: Diagnosis present

## 2017-05-10 DIAGNOSIS — K5909 Other constipation: Secondary | ICD-10-CM | POA: Diagnosis present

## 2017-05-10 DIAGNOSIS — R0902 Hypoxemia: Secondary | ICD-10-CM | POA: Diagnosis present

## 2017-05-10 DIAGNOSIS — R5081 Fever presenting with conditions classified elsewhere: Secondary | ICD-10-CM | POA: Diagnosis present

## 2017-05-10 DIAGNOSIS — Z8673 Personal history of transient ischemic attack (TIA), and cerebral infarction without residual deficits: Secondary | ICD-10-CM

## 2017-05-10 DIAGNOSIS — D638 Anemia in other chronic diseases classified elsewhere: Secondary | ICD-10-CM | POA: Diagnosis present

## 2017-05-10 LAB — CBC
HCT: 20.8 % — ABNORMAL LOW (ref 36.0–46.0)
HEMOGLOBIN: 7 g/dL — AB (ref 12.0–15.0)
MCH: 30.2 pg (ref 26.0–34.0)
MCHC: 33.7 g/dL (ref 30.0–36.0)
MCV: 89.7 fL (ref 78.0–100.0)
PLATELETS: 356 10*3/uL (ref 150–400)
RBC: 2.32 MIL/uL — AB (ref 3.87–5.11)
RDW: 27.7 % — ABNORMAL HIGH (ref 11.5–15.5)
WBC: 9.9 10*3/uL (ref 4.0–10.5)

## 2017-05-10 LAB — URINALYSIS, ROUTINE W REFLEX MICROSCOPIC
Bilirubin Urine: NEGATIVE
Glucose, UA: NEGATIVE mg/dL
Ketones, ur: NEGATIVE mg/dL
Leukocytes, UA: NEGATIVE
Nitrite: NEGATIVE
Protein, ur: NEGATIVE mg/dL
Specific Gravity, Urine: 1.006 (ref 1.005–1.030)
pH: 5 (ref 5.0–8.0)

## 2017-05-10 LAB — COMPREHENSIVE METABOLIC PANEL
ALBUMIN: 4.3 g/dL (ref 3.5–5.0)
ALK PHOS: 74 U/L (ref 38–126)
ALT: 26 U/L (ref 14–54)
AST: 75 U/L — AB (ref 15–41)
Anion gap: 7 (ref 5–15)
BUN: <5 mg/dL — ABNORMAL LOW (ref 6–20)
CHLORIDE: 107 mmol/L (ref 101–111)
CO2: 22 mmol/L (ref 22–32)
CREATININE: 0.34 mg/dL — AB (ref 0.44–1.00)
Calcium: 8.7 mg/dL — ABNORMAL LOW (ref 8.9–10.3)
GFR calc non Af Amer: >60 mL/min (ref >60)
GLUCOSE: 105 mg/dL — AB (ref 65–99)
Potassium: 3.7 mmol/L (ref 3.5–5.1)
SODIUM: 136 mmol/L (ref 135–145)
Total Bilirubin: 4.9 mg/dL — ABNORMAL HIGH (ref 0.3–1.2)
Total Protein: 8.3 g/dL — ABNORMAL HIGH (ref 6.5–8.1)

## 2017-05-10 LAB — RETICULOCYTES
RBC.: 2.32 MIL/uL — ABNORMAL LOW (ref 3.87–5.11)
RETIC CT PCT: 13.4 % — AB (ref 0.4–3.1)
Retic Count, Absolute: 310.9 10*3/uL — ABNORMAL HIGH (ref 19.0–186.0)

## 2017-05-10 LAB — I-STAT TROPONIN, ED
TROPONIN I, POC: 0 ng/mL (ref 0.00–0.08)
TROPONIN I, POC: 0 ng/mL (ref 0.00–0.08)

## 2017-05-10 LAB — POC URINE PREG, ED: Preg Test, Ur: NEGATIVE

## 2017-05-10 LAB — INFLUENZA PANEL BY PCR (TYPE A & B)
INFLBPCR: NEGATIVE
Influenza A By PCR: POSITIVE — AB

## 2017-05-10 MED ORDER — ALBUTEROL SULFATE (2.5 MG/3ML) 0.083% IN NEBU
2.5000 mg | INHALATION_SOLUTION | Freq: Two times a day (BID) | RESPIRATORY_TRACT | Status: DC
  Administered 2017-05-11 – 2017-05-15 (×7): 2.5 mg via RESPIRATORY_TRACT
  Filled 2017-05-10 (×8): qty 3

## 2017-05-10 MED ORDER — DIPHENHYDRAMINE HCL 25 MG PO CAPS
25.0000 mg | ORAL_CAPSULE | ORAL | Status: DC | PRN
  Administered 2017-05-10: 25 mg via ORAL
  Filled 2017-05-10: qty 1

## 2017-05-10 MED ORDER — FOLIC ACID 1 MG PO TABS
1.0000 mg | ORAL_TABLET | Freq: Every day | ORAL | Status: DC
  Administered 2017-05-10 – 2017-05-14 (×3): 1 mg via ORAL
  Filled 2017-05-10 (×5): qty 1

## 2017-05-10 MED ORDER — ACETAMINOPHEN 325 MG PO TABS
650.0000 mg | ORAL_TABLET | ORAL | Status: DC | PRN
  Administered 2017-05-10 – 2017-05-15 (×4): 650 mg via ORAL
  Filled 2017-05-10 (×4): qty 2

## 2017-05-10 MED ORDER — KETOROLAC TROMETHAMINE 30 MG/ML IJ SOLN
30.0000 mg | Freq: Four times a day (QID) | INTRAMUSCULAR | Status: DC
  Administered 2017-05-10 – 2017-05-14 (×15): 30 mg via INTRAVENOUS
  Filled 2017-05-10 (×15): qty 1

## 2017-05-10 MED ORDER — METOCLOPRAMIDE HCL 5 MG/ML IJ SOLN
10.0000 mg | Freq: Once | INTRAMUSCULAR | Status: AC
  Administered 2017-05-10: 10 mg via INTRAVENOUS
  Filled 2017-05-10: qty 2

## 2017-05-10 MED ORDER — SODIUM CHLORIDE 0.45 % IV SOLN
INTRAVENOUS | Status: DC
  Administered 2017-05-10 – 2017-05-12 (×3): via INTRAVENOUS

## 2017-05-10 MED ORDER — HYDROXYUREA 500 MG PO CAPS
1000.0000 mg | ORAL_CAPSULE | Freq: Every day | ORAL | Status: DC
  Administered 2017-05-11: 1000 mg via ORAL
  Filled 2017-05-10 (×4): qty 2

## 2017-05-10 MED ORDER — ONDANSETRON HCL 4 MG/2ML IJ SOLN
4.0000 mg | INTRAMUSCULAR | Status: DC | PRN
  Administered 2017-05-10: 4 mg via INTRAVENOUS
  Filled 2017-05-10: qty 2

## 2017-05-10 MED ORDER — KETOROLAC TROMETHAMINE 15 MG/ML IJ SOLN
15.0000 mg | INTRAMUSCULAR | Status: AC
  Administered 2017-05-10: 15 mg via INTRAVENOUS
  Filled 2017-05-10: qty 1

## 2017-05-10 MED ORDER — SODIUM CHLORIDE 0.9 % IV BOLUS (SEPSIS)
500.0000 mL | Freq: Once | INTRAVENOUS | Status: AC
  Administered 2017-05-10: 500 mL via INTRAVENOUS

## 2017-05-10 MED ORDER — HYDROMORPHONE HCL 1 MG/ML IJ SOLN: 1.0000 mg | INTRAMUSCULAR | Status: DC

## 2017-05-10 MED ORDER — OSELTAMIVIR PHOSPHATE 75 MG PO CAPS
75.0000 mg | ORAL_CAPSULE | Freq: Two times a day (BID) | ORAL | Status: DC
  Administered 2017-05-11 – 2017-05-15 (×8): 75 mg via ORAL
  Filled 2017-05-10 (×11): qty 1

## 2017-05-10 MED ORDER — OSELTAMIVIR PHOSPHATE 75 MG PO CAPS: 75.0000 mg | ORAL_CAPSULE | Freq: Once | ORAL | Status: DC

## 2017-05-10 MED ORDER — ENOXAPARIN SODIUM 40 MG/0.4ML ~~LOC~~ SOLN
40.0000 mg | SUBCUTANEOUS | Status: DC
  Administered 2017-05-10 – 2017-05-13 (×4): 40 mg via SUBCUTANEOUS
  Filled 2017-05-10 (×5): qty 0.4

## 2017-05-10 MED ORDER — DIPHENHYDRAMINE HCL 50 MG/ML IJ SOLN
12.5000 mg | Freq: Four times a day (QID) | INTRAMUSCULAR | Status: DC | PRN
  Administered 2017-05-12 – 2017-05-14 (×4): 12.5 mg via INTRAVENOUS
  Administered 2017-05-14: 50 mg via INTRAVENOUS
  Administered 2017-05-14 (×2): 12.5 mg via INTRAVENOUS
  Filled 2017-05-10 (×8): qty 1

## 2017-05-10 MED ORDER — HYDROMORPHONE HCL 1 MG/ML IJ SOLN: 0.5000 mg | INTRAMUSCULAR | Status: AC

## 2017-05-10 MED ORDER — DIPHENHYDRAMINE HCL 50 MG/ML IJ SOLN
12.5000 mg | Freq: Once | INTRAMUSCULAR | Status: AC
  Administered 2017-05-10: 12.5 mg via INTRAVENOUS
  Filled 2017-05-10: qty 1

## 2017-05-10 MED ORDER — HYDROMORPHONE HCL 4 MG PO TABS
6.0000 mg | ORAL_TABLET | ORAL | Status: DC
  Administered 2017-05-10 – 2017-05-12 (×3): 6 mg via ORAL
  Filled 2017-05-10 (×3): qty 2
  Filled 2017-05-10: qty 3
  Filled 2017-05-10: qty 2

## 2017-05-10 MED ORDER — ACETAMINOPHEN 325 MG PO TABS
650.0000 mg | ORAL_TABLET | Freq: Once | ORAL | Status: AC
  Administered 2017-05-10: 650 mg via ORAL
  Filled 2017-05-10: qty 2

## 2017-05-10 MED ORDER — HYDROMORPHONE HCL 1 MG/ML IJ SOLN
0.5000 mg | INTRAMUSCULAR | Status: AC
  Administered 2017-05-10: 0.5 mg via INTRAVENOUS
  Filled 2017-05-10: qty 1

## 2017-05-10 MED ORDER — SODIUM CHLORIDE 0.45 % IV SOLN
INTRAVENOUS | Status: DC
  Administered 2017-05-10 (×2): via INTRAVENOUS

## 2017-05-10 MED ORDER — OSELTAMIVIR PHOSPHATE 75 MG PO CAPS
75.0000 mg | ORAL_CAPSULE | Freq: Once | ORAL | Status: AC
  Administered 2017-05-10: 75 mg via ORAL
  Filled 2017-05-10: qty 1

## 2017-05-10 MED ORDER — BUDESONIDE 0.25 MG/2ML IN SUSP
0.2500 mg | Freq: Two times a day (BID) | RESPIRATORY_TRACT | Status: DC
  Administered 2017-05-10 – 2017-05-16 (×10): 0.25 mg via RESPIRATORY_TRACT
  Filled 2017-05-10 (×11): qty 2

## 2017-05-10 MED ORDER — ALBUTEROL SULFATE (2.5 MG/3ML) 0.083% IN NEBU
5.0000 mg | INHALATION_SOLUTION | Freq: Once | RESPIRATORY_TRACT | Status: AC
  Administered 2017-05-10: 5 mg via RESPIRATORY_TRACT
  Filled 2017-05-10: qty 6

## 2017-05-10 MED ORDER — ALBUTEROL SULFATE (2.5 MG/3ML) 0.083% IN NEBU: 2.5000 mg | INHALATION_SOLUTION | RESPIRATORY_TRACT | Status: DC | PRN

## 2017-05-10 MED ORDER — METOCLOPRAMIDE HCL 5 MG/ML IJ SOLN
10.0000 mg | Freq: Four times a day (QID) | INTRAMUSCULAR | Status: DC | PRN
  Administered 2017-05-11 – 2017-05-16 (×13): 10 mg via INTRAVENOUS
  Filled 2017-05-10 (×13): qty 2

## 2017-05-10 MED ORDER — HYDROMORPHONE HCL 2 MG PO TABS: 2.0000 mg | ORAL_TABLET | Freq: Once | ORAL | Status: DC

## 2017-05-10 MED ORDER — HYDROMORPHONE HCL 1 MG/ML IJ SOLN
1.0000 mg | INTRAMUSCULAR | Status: DC
  Filled 2017-05-10: qty 1

## 2017-05-10 MED ORDER — HYDROMORPHONE HCL 1 MG/ML IJ SOLN
0.5000 mg | INTRAMUSCULAR | Status: DC | PRN
  Administered 2017-05-10 – 2017-05-14 (×12): 0.5 mg via INTRAVENOUS
  Filled 2017-05-10 (×7): qty 1
  Filled 2017-05-10: qty 0.5
  Filled 2017-05-10 (×6): qty 1

## 2017-05-10 NOTE — ED Provider Notes (Signed)
  Face-to-face evaluation   History: She presents for evaluation of fever, rhinorrhea, sore throat and cough, for 2 days.  She also feels like she has sickle cell crisis, with pain in her back and legs.  Physical exam: Slender, alert, cooperative.  No dysarthria or aphasia.  No respiratory distress.  Oral mucous membranes are moist.  Medical screening examination/treatment/procedure(s) were conducted as a shared visit with non-physician practitioner(s) and myself.  I personally evaluated the patient during the encounter    Mancel BaleWentz, Teosha Casso, MD 05/11/17 1203

## 2017-05-10 NOTE — ED Provider Notes (Signed)
[facility] Provider Note   CSN: [id number] Arrival date & time: 05/10/17  0210     History   Chief Complaint Chief Complaint  Patient presents with  . flu like sx    HPI Valerie Reid is a 27 y.o. female.  HPI   Pt is a 27 y/o female with a story of asthma and sickle cell anemia who presents the ED today complaining of HA, fevers, rhinorrhea, generalized weakness, productive cough with yellow sputum, chest congestion, nasal congestion, body aches, headache that began yesterday. States headache is frontal and this feels consistent with her usual migraines. Denies vision changes, numbness, tingling, unilateral weakness. She reports central chest pain that is consistent with past sickle cell pain crisis. Denies a h/o acute chest syndrome.  She reports shortness of breath, has tried using her atrovent inhaler with no relief. States that her boyfriend was recently diagnosed with flu like illness. Also c/o sickle cell crisis with pain to bilat lower legs, bilat knees, and right lower back/buttock pain that radiates down the back of her right leg.  Past Medical History:  Diagnosis Date  . Asthma   . Sickle cell anemia [facility])     Patient Active Problem List   Diagnosis Date Noted  . Influenza A 05/10/2017  . 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 Anemia of chronic disease 08/19/2015  . Airway hyperreactivity 03/27/2015  . FO (foramen ovale) 01/01/2013  . 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 CHOLECYSTECTOMY    . INDUCED ABORTION N/A   . port-a-cath insertion     . PORT-A-CATH REMOVAL      OB History    No data available       Home Medications    Prior to Admission medications   Medication Sig Start Date End Date Taking? Authorizing Provider  fluticasone (FLOVENT HFA) 110 MCG/ACT inhaler Inhale 2 puffs into the lungs  2 (two) times daily.   Yes [provider]  folic acid (FOLVITE) 1 MG tablet Take 1 mg by mouth daily.   Yes [provider]  cephALEXin (KEFLEX) 500 MG capsule 2 caps po bid x 7 days Patient not taking: Reported on 05/10/2017 04/22/17   Arthor Captain, PA-C  HYDROmorphone (DILAUDID) 2 MG tablet Take 1 tablet (2 mg total) by mouth every 4 (four) hours as needed for moderate pain or severe pain (except fro HA). Patient not taking: Reported on 05/10/2017 11/28/16   Altha Harm, MD  hydroxyurea (HYDREA) 500 MG capsule Take 2 capsules (1,000 mg total) by mouth daily. May take with food to minimize GI side effects. Patient not taking: Reported on 05/10/2017 11/29/16   Altha Harm, MD  naproxen (NAPROSYN) 250 MG tablet Take 1 tablet (250 mg total) by mouth 2 (two) times daily as needed for headache. Patient not taking: Reported on 05/10/2017 11/28/16   Altha Harm, MD    Family History Family History  Problem Relation Age of Onset  . Sickle cell anemia Sister   . Sickle cell trait Mother   . Sickle cell trait Father     Social History Social History   Tobacco Use  . Smoking status: Never Smoker  . Smokeless tobacco: Never Used  Substance Use Topics  . Alcohol use: No  . Drug use: No     Allergies   Excedrin migraine [asa-apap-caff buffered]; Fioricet [butalbital-apap-caffeine];  and Imitrex [sumatriptan]   Review of Systems Review of Systems  Constitutional: Positive for chills and fever.  HENT: Positive for congestion and rhinorrhea. Negative for ear pain, sore throat and trouble swallowing.   Eyes: Negative for pain and visual disturbance.  Respiratory: Positive for cough and shortness of breath.   Cardiovascular: Positive for chest pain. Negative for leg swelling.  Gastrointestinal: Positive for nausea. Negative for abdominal pain, constipation, diarrhea and vomiting.  Genitourinary: Negative for flank pain.  Musculoskeletal: Positive for  back pain and myalgias. Negative for gait problem and neck pain.       Leg pain, knee pain  Skin: Negative for rash.  Neurological: Positive for headaches. Negative for dizziness, weakness, light-headedness and numbness.     Physical Exam Updated Vital Signs BP (!) 96/41 (BP Location: Right Arm)   Pulse (!) 120   Temp (!) 102.8 F (39.3 C) (Oral)   Resp 16   Wt 47.6 kg (105 lb)   LMP 05/02/2017   SpO2 95%   BMI 19.20 kg/m   Physical Exam  Constitutional: She appears well-developed and well-nourished.  Appears uncomfortable  HENT:  Head: Normocephalic and atraumatic.  Right Ear: External ear normal.  Left Ear: External ear normal.  Mouth/Throat: Oropharynx is clear and moist.  Left tonsil 3+ and right tonsil 2+ (patient states that this is chronic for her), no pharyngeal erythema, no tonsillar exudates, no tonsillar kissing, uvula midline, tolerating secretions, normal voice, no evidence of PTA  Eyes: Conjunctivae and EOM are normal. Pupils are equal, round, and reactive to light.  No pain with EOM  Neck: Normal range of motion. Neck supple.  No nuchal rigidity, able to flex chin to chest  Cardiovascular: Normal rate, regular rhythm, normal heart sounds and intact distal pulses.  No murmur heard. Pulmonary/Chest: Effort normal. No stridor. No respiratory distress. She has no wheezes.  Crackles to LLB, no tachypnea, speaking in full sentences  Abdominal: Soft. Bowel sounds are normal. She exhibits no distension. There is no tenderness. There is no guarding.  Musculoskeletal: She exhibits no edema.  Neurological: She is alert.  Mental Status:  Alert, thought content appropriate, able to give a coherent history. Speech fluent without evidence of aphasia. Able to follow 2 step commands without difficulty.  Cranial Nerves:  II: pupils equal, round, reactive to light III,IV, VI: ptosis not present, extra-ocular motions intact bilaterally  V,VII: smile symmetric, facial light  touch sensation equal VIII: hearing grossly normal to voice  X: uvula elevates symmetrically  XI: bilateral shoulder shrug symmetric and strong XII: midline tongue extension without fassiculations Motor:  Normal tone. 5/5 strength of BUE and BLE major muscle groups including strong and equal grip strength and dorsiflexion/plantar flexion Sensory: light touch normal in all extremities. DTRs: patellar DTRs 2+ symmetric b/l Cerebellar: normal finger-to-nose with bilateral upper extremities  CV: 2+ radial and DP/PT pulses No pronator drift, no facial droop  Skin: Skin is warm and dry. Capillary refill takes less than 2 seconds.  Psychiatric: She has a normal mood and affect.  Nursing note and vitals reviewed.    ED Treatments / Results  Labs (all labs ordered are listed, but only abnormal results are displayed) Labs Reviewed  CBC - Abnormal; Notable for the following components:      Result Value   RBC 2.32 (*)    Hemoglobin 7.0 (*)    HCT 20.8 (*)    RDW 27.7 (*)    All other components within normal limits  COMPREHENSIVE  METABOLIC PANEL - Abnormal; Notable for the following components:   Glucose, Bld 105 (*)    BUN <5 (*)    Creatinine, Ser 0.34 (*)    Calcium 8.7 (*)    Total Protein 8.3 (*)    AST 75 (*)    Total Bilirubin 4.9 (*)    All other components within normal limits  INFLUENZA PANEL BY PCR (TYPE A & B) - Abnormal; Notable for the following components:   Influenza A By PCR POSITIVE (*)    All other components within normal limits  RETICULOCYTES - Abnormal; Notable for the following components:   Retic Ct Pct 13.4 (*)    RBC. 2.32 (*)    Retic Count, Absolute 310.9 (*)    All other components within normal limits  CULTURE, BLOOD (ROUTINE X 2)  CULTURE, BLOOD (ROUTINE X 2)  URINALYSIS, ROUTINE W REFLEX MICROSCOPIC  POC URINE PREG, ED  I-STAT TROPONIN, ED  I-STAT TROPONIN, ED    EKG  EKG Interpretation  Date/Time:  Thursday May 10 2017 10:35:16  EST Ventricular Rate:  113 PR Interval:    QRS Duration: 87 QT Interval:  315 QTC Calculation: 432 R Axis:   70 Text Interpretation:  Sinus tachycardia Repol abnrm suggests ischemia, anterolateral Since last tracing ST/T wave abnormality is new, with faster rate Confirmed by Mancel BaleWentz, Elliott [phone]([id number]) on 05/10/2017 11:54:52 AM       Radiology Dg Chest 2 View  Result Date: 05/10/2017 CLINICAL DATA:  Fever and cough for 1 day EXAM: CHEST - 2 VIEW COMPARISON:  11/25/2016 FINDINGS: Cardiac shadow is stable. Persistent scarring is noted particularly in the apices bilaterally. No focal infiltrate or sizable effusion is seen. No bony abnormality is noted. IMPRESSION: Chronic scarring particularly in the apices bilaterally. No acute abnormality noted. Electronically Signed   By: Alcide CleverMark  Lukens M.D.   On: 05/10/2017 10:41    Procedures Procedures (including critical care time)  Medications Ordered in ED Medications  acetaminophen (TYLENOL) tablet 650 mg (650 mg Oral Given 05/10/17 1657)  0.45 % sodium chloride infusion ( Intravenous New Bag/Given 05/10/17 1830)  hydroxyurea (HYDREA) capsule 1,000 mg (not administered)  budesonide (PULMICORT) nebulizer solution 0.25 mg (not administered)  folic acid (FOLVITE) tablet 1 mg (1 mg Oral Given 05/10/17 1834)  enoxaparin (LOVENOX) injection 40 mg (40 mg Subcutaneous Given 05/10/17 1829)  HYDROmorphone (DILAUDID) tablet 6 mg (6 mg Oral Given 05/10/17 1829)  HYDROmorphone (DILAUDID) injection 0.5 mg (not administered)  oseltamivir (TAMIFLU) capsule 75 mg (not administered)  metoCLOPramide (REGLAN) injection 10 mg (10 mg Intravenous Given 05/10/17 1829)    Followed by  metoCLOPramide (REGLAN) injection 10 mg (not administered)  diphenhydrAMINE (BENADRYL) injection 12.5 mg (12.5 mg Intravenous Given 05/10/17 1828)    Followed by  diphenhydrAMINE (BENADRYL) injection 12.5 mg (not administered)  ketorolac (TORADOL) 30 MG/ML injection 30 mg (30 mg Intravenous Given 05/10/17  1829)  ketorolac (TORADOL) 15 MG/ML injection 15 mg (15 mg Intravenous Given 05/10/17 1103)  HYDROmorphone (DILAUDID) injection 0.5 mg (0.5 mg Intravenous Given 05/10/17 1103)    Or  HYDROmorphone (DILAUDID) injection 0.5 mg ( Subcutaneous See Alternative 05/10/17 1103)  albuterol (PROVENTIL) (2.5 MG/3ML) 0.083% nebulizer solution 5 mg (5 mg Nebulization Given 05/10/17 1108)  acetaminophen (TYLENOL) tablet 650 mg (650 mg Oral Given 05/10/17 1119)  sodium chloride 0.9 % bolus 500 mL (0 mLs Intravenous Stopped 05/10/17 1338)  albuterol (PROVENTIL) (2.5 MG/3ML) 0.083% nebulizer solution 5 mg (5 mg Nebulization Given 05/10/17 1520)  oseltamivir (TAMIFLU) capsule  75 mg (75 mg Oral Given 05/10/17 1520)  sodium chloride 0.9 % bolus 500 mL (0 mLs Intravenous Stopped 05/10/17 1506)     Initial Impression / Assessment and Plan / ED Course  I have reviewed the triage vital signs and the nursing notes.  Pertinent labs & imaging results that were available during my care of the patient were reviewed by me and considered in my medical decision making (see chart for details).  Discussed pt presentation and exam findings with Dr. Effie Shy, who personally evaluated pt and agrees with the plan to admit.    12:57 PM Discussed pharmacist  states pt can take Tamiflu.  1:03 PM Discussed pt case with Dr. Ashley Royalty. States tamiflu is okay to give patient as well as toradol.  States that despite patient's hypotension pt can have p.o. Dilaudid if she has continued pain.  recommended starting at patient's normal dose of 2 mg p.o.  Also advised to give 1 L normal saline bolus as patient is likely dehydrated.  She will accept the patient into her care for observation.  Reevaluated patient and she states that her breathing has improved after nebulizer treatment, however feels that she would from additional treatment.  Repeat pulmonary exam reveals increased airflow bilaterally, with expiratory wheezing bilaterally.  Also complaining of lower  back pain after first dose Dilaudid.  Will give p.o. Dilaudid.  First fluid bolus given.  BP improved to 90 systolic.  Tachycardia improved to 95.  Satting at 97% on 3 L.  No tachypnea.  Patient sitting comfortably in bed.   Final Clinical Impressions(s) / ED Diagnoses   Final diagnoses:  Influenza A  Sickle cell crisis (HCC)   27 year old female with history of sickle cell anemia presenting with flulike illness as well as sickle cell crisis.   Initially tachycardic to the 116 and febrile to 103.1. Initial BP 117/59 with O2 sat at 95% on RA. RR 19.   Patient given fluids 1/2 NS, Zofran, Benadryl, Toradol, IV Dilaudid, nebulizer treatment, Tylenol.  Patient became hypotensive in the 80s after first dose Dilaudid, initiated with fluid bolus with normal saline.  Pressures improved slightly to 90 systolic.  According to prior visits, this appears baseline for patient.   Influenza a positive.  First dose Tamiflu given in the ED.  Hemoglobin 7, and it appears to be at patient's baseline.  Reticulocyte's appropriately elevated.  Creatinine within normal limits. ECG did show sinus tach as well as some ST T wave changes that were nonspecific.  This could be due to tachycardia and volume depletion. Troponin negative x2, chest x-ray negative for acute chest.  Chest pain completely resolved after first dose of Dilaudid.  Patient would likely benefit from further inpatient treatment for sickle cell crisis and treatment of influenza A.  Accepted for admission by Dr. Ashley Royalty.  ED Discharge Orders    None       Rayne Du 05/10/17 1925    Mancel Bale, MD 05/11/17 [phone]

## 2017-05-10 NOTE — ED Notes (Signed)
Pt updated about wait times 

## 2017-05-10 NOTE — ED Notes (Signed)
Patient in xray 

## 2017-05-10 NOTE — ED Notes (Signed)
Attempted blood draw Rt hand, attempt unsuccessful.

## 2017-05-10 NOTE — H&P (Signed)
Hospital Admission Note Date: 05/10/2017  Patient name: Valerie MouldsYaneth Reid Medical record number: [id number] Date of birth: [birth year] Age: 27 y.o. Gender: female PCP: Ledon SnareFarland, Sandra, MD  Attending physician: Altha HarmMatthews, Michelle A, MD  Chief Complaint:  History of Present Illness:Pt with Sickle Cell Disease admitted with influenza. She describes a sudden onset of fever, myalgias and rigors. She also had an escalation of her sickle cell pain. She is unable to rate the intensity of her pain but she states that she feels the worst that she's ever felt. She states that she's afraid that she will die as she has heard of patients with SCD dying if they become very ill. She is unaware of any sick contacts. Presently she is febrile and c/o HA. She denies a stiff neck but does have photophobia associated with the HA.   In the ED she was febrile and tachycardic with some hypotension only after receiving IV Dilaudid. A rapid flu swab was positive for Influenza A. In the ED she received a dose of Toradol, 2 doses of Dilaudid by IV and IVF. I am asked to admit patient for SIRS due to influenza and also for Sickle Cell Crisis. Pt had an episode in the past with IV Dilaudid where she required Narcan and she is very afraid of IV opiates except with the use of the PCA. However she is too ill to actually use the PCA  Scheduled Meds: . budesonide  0.25 mg Nebulization BID  . diphenhydrAMINE  12.5 mg Intravenous Once  . enoxaparin (LOVENOX) injection  40 mg Subcutaneous Q24H  . folic acid  1 mg Oral Daily  . HYDROmorphone  6 mg Oral Q4H  . hydroxyurea  1,000 mg Oral Daily  . ketorolac  30 mg Intravenous Q6H  . metoCLOPramide (REGLAN) injection  10 mg Intravenous Once  . oseltamivir  75 mg Oral BID   Continuous Infusions: . sodium chloride     PRN Meds:.acetaminophen, diphenhydrAMINE **FOLLOWED BY** diphenhydrAMINE, HYDROmorphone (DILAUDID) injection, metoCLOPramide (REGLAN) injection **FOLLOWED BY**  metoCLOPramide (REGLAN) injection Allergies: Excedrin migraine [asa-apap-caff buffered]; Fioricet [butalbital-apap-caffeine]; and Imitrex [sumatriptan] Past Medical History:  Diagnosis Date  . Asthma   . Sickle cell anemia (HCC)    Past Surgical History:  Procedure Laterality Date  . CHOLECYSTECTOMY    . INDUCED ABORTION N/A   . port-a-cath insertion     . PORT-A-CATH REMOVAL     Family History  Problem Relation Age of Onset  . Sickle cell anemia Sister   . Sickle cell trait Mother   . Sickle cell trait Father    Social History   Socioeconomic History  . Marital status: Single    Spouse name: Not on file  . Number of children: Not on file  . Years of education: Not on file  . Highest education level: Not on file  Social Needs  . Financial resource strain: Not on file  . Food insecurity - worry: Not on file  . Food insecurity - inability: Not on file  . Transportation needs - medical: Not on file  . Transportation needs - non-medical: Not on file  Occupational History  . Not on file  Tobacco Use  . Smoking status: Never Smoker  . Smokeless tobacco: Never Used  Substance and Sexual Activity  . Alcohol use: No  . Drug use: No  . Sexual activity: Yes  Other Topics Concern  . Not on file  Social History Narrative  . Not on file   Review of Systems: Pertinent items  noted in HPI and remainder of comprehensive ROS otherwise negative.  Physical Exam: No intake or output data in the 24 hours ending 05/10/17 1738 General: Alert, awake, oriented x 3. She is ill appearing and in distress due ti pain and acute influenza illness.  HEENT: [place]/AT PEERL, EOMI, anicteric Neck: Trachea midline,  no masses, no thyromegal,y no JVD, no carotid bruit OROPHARYNX:  Moist, No exudate/ erythema/lesions.  Heart: Regular rate and rhythm, without murmurs, rubs, gallops, PMI non-displaced, no heaves or thrills on palpation.  Lungs: Clear to auscultation, no wheezing or rhonchi noted. No  increased vocal fremitus resonant to percussion  Abdomen: Soft, nontender, nondistended, positive bowel sounds, no masses no hepatosplenomegaly noted..  Neuro: No focal neurological deficits noted cranial nerves II through XII grossly intact.  Strength at baselinein bilateral upper and lower extremities. Musculoskeletal: No warmth swelling or erythema around joints, no spinal tenderness noted. Psychiatric: Patient alert and oriented x3, good insight and cognition, good recent to remote recall.   Lab results: Recent Labs    05/10/17 1056  NA 136  K 3.7  CL 107  CO2 22  GLUCOSE 105*  BUN <5*  CREATININE 0.34*  CALCIUM 8.7*   Recent Labs    05/10/17 1056  AST 75*  ALT 26  ALKPHOS 74  BILITOT 4.9*  PROT 8.3*  ALBUMIN 4.3   No results for input(s): LIPASE, AMYLASE in the last 72 hours. Recent Labs    05/10/17 1056  WBC 9.9  HGB 7.0*  HCT 20.8*  MCV 89.7  PLT 356   No results for input(s): CKTOTAL, CKMB, CKMBINDEX, TROPONINI in the last 72 hours. Invalid input(s): POCBNP No results for input(s): DDIMER in the last 72 hours. No results for input(s): HGBA1C in the last 72 hours. No results for input(s): CHOL, HDL, LDLCALC, TRIG, CHOLHDL, LDLDIRECT in the last 72 hours. No results for input(s): TSH, T4TOTAL, T3FREE, THYROIDAB in the last 72 hours.  Invalid input(s): FREET3 Recent Labs    05/10/17 1056  RETICCTPCT 13.4*   Imaging results:  Dg Chest 2 View  Result Date: 05/10/2017 CLINICAL DATA:  Fever and cough for 1 day EXAM: CHEST - 2 VIEW COMPARISON:  11/25/2016 FINDINGS: Cardiac shadow is stable. Persistent scarring is noted particularly in the apices bilaterally. No focal infiltrate or sizable effusion is seen. No bony abnormality is noted. IMPRESSION: Chronic scarring particularly in the apices bilaterally. No acute abnormality noted. Electronically Signed   By: Alcide Clever M.D.   On: 05/10/2017 10:41    Assessment and Plan: 1. Influenza A: Continue Tamiflu 75  mg BID.  2. SIRS/Fever: Due to influenza infection. Will obtain blood cultures and urine studies. Also treat supportively with antipyretics and IVF to maintain hemodynamic stability. Check strict I&O.  3. Hypotension: Pt became hypotensive after receiving IV Dilaudid. However given her current clinical state I will observe closely as it could be a reflection of impending sepsis associated with influenza infection and coincidental to the opiates. Will monitor BP more frequently- q 2 hours for the next 12 hours then q 4 hours if stable .  4. Hb SS with Crisis: Will treat with oral Dilaudid and PRN IV Dilaudid as she too ill to manage the PCA. Also treat with Toradol Re-evaluate tomorrow.  5. Headache: Pt has had a CVA in the past and vaso-constrictive HA medications are contraindicated. HA cocktail with IV Reglan and IV Benadryl ordered .  6. DVT Prophylaxis: Lovenox.   MATTHEWS,MICHELLE A. 05/10/2017, 5:38 PM  MATTHEWS,MICHELLE A.  Pager [phone]. If 7PM-7AM, please contact night-coverage.

## 2017-05-10 NOTE — Progress Notes (Addendum)
Pt vomited shortly after arrival on 3W; some concern for aspiration; adventitious lung sounds. Bedside pulse ox reveals 98% 3L, pulse 95. RR 15. Will spot check for tachypnea; tachycardia may have resolved. Pt resting now post Dilaudid & APAP.

## 2017-05-10 NOTE — ED Notes (Signed)
ED TO INPATIENT HANDOFF REPORT  Name/Age/Gender Valerie Reid 27 y.o. female  Code Status Code Status History    Date Active Date Inactive Code Status Order ID Comments User Context   11/25/2016 14:13 11/29/2016 19:27 Full Code [id number]  Elwyn Reach, MD Inpatient   03/31/2016 16:21 04/03/2016 14:58 Full Code [id number]  Leana Gamer, MD Inpatient   08/19/2015 13:10 08/22/2015 13:36 Full Code [id number]  Leana Gamer, MD Inpatient      Home/SNF/Other Home  Chief Complaint flu sx   Level of Care/Admitting Diagnosis ED Disposition    ED Disposition Condition [place]: [facility] [[id number]]  Level of Care: Med-Surg [16]  Diagnosis: Influenza A [427062]  Admitting Physician: Leana Gamer [[id number]]  Attending Physician: Liston Alba A [[id number]]  PT Class (Do Not Modify): Observation [104]  PT Acc Code (Do Not Modify): Observation [10022]       Medical History Past Medical History:  Diagnosis Date  . Asthma   . Sickle cell anemia (HCC)     Allergies Allergies  Allergen Reactions  . Excedrin Migraine [Asa-Apap-Caff Buffered]     Cannot be used due to previous CVA in Hb SS  . Fioricet [Butalbital-Apap-Caffeine]     Cannot be used due to previous CVA in Hb SS  . Imitrex [Sumatriptan]     Cannot be used due to previous CVA in Hb SS    IV Location/Drains/Wounds Patient Lines/Drains/Airways Status   Active Line/Drains/Airways    Name:   Placement date:   Placement time:   Site:   Days:   Peripheral IV 05/10/17 Left Forearm   05/10/17    1053    Forearm   less than 1          Labs/Imaging Results for orders placed or performed during the hospital encounter of 05/10/17 (from the past 45 hour(s))  Influenza panel by PCR (type A & B)     Status: Abnormal   Collection Time: 05/10/17 10:37 AM  Result Value Ref Range   Influenza A By PCR POSITIVE (A) NEGATIVE   Influenza B By PCR NEGATIVE NEGATIVE     Comment: (NOTE) The Xpert Xpress Flu assay is intended as an aid in the diagnosis of  influenza and should not be used as a sole basis for treatment.  This  assay is FDA approved for nasopharyngeal swab specimens only. Nasal  washings and aspirates are unacceptable for Xpert Xpress Flu testing. Performed at [facility], [place] [address] [place], [place] [address]   POC Urine Pregnancy, ED (do NOT order at [facility])     Status: None   Collection Time: 05/10/17 10:54 AM  Result Value Ref Range   Preg Test, Ur NEGATIVE NEGATIVE    Comment:        THE SENSITIVITY OF THIS METHODOLOGY IS >24 mIU/mL   CBC     Status: Abnormal   Collection Time: 05/10/17 10:56 AM  Result Value Ref Range   WBC 9.9 4.0 - 10.5 K/uL   RBC 2.32 (L) 3.87 - 5.11 MIL/uL   Hemoglobin 7.0 (L) 12.0 - 15.0 g/dL   HCT 20.8 (L) 36.0 - 46.0 %   MCV 89.7 78.0 - 100.0 fL   MCH 30.2 26.0 - 34.0 pg   MCHC 33.7 30.0 - 36.0 g/dL   RDW 27.7 (H) 11.5 - 15.5 %   Platelets 356 150 - 400 K/uL    Comment: Performed at [facility], [place] [address], [place], [place] [address]  Comprehensive metabolic panel     Status: Abnormal   Collection Time: 05/10/17 10:56 AM  Result Value Ref Range   Sodium 136 135 - 145 mmol/L   Potassium 3.7 3.5 - 5.1 mmol/L   Chloride 107 101 - 111 mmol/L   CO2 22 22 - 32 mmol/L   Glucose, Bld 105 (H) 65 - 99 mg/dL   BUN <5 (L) 6 - 20 mg/dL   Creatinine, Ser 0.34 (L) 0.44 - 1.00 mg/dL   Calcium 8.7 (L) 8.9 - 10.3 mg/dL   Total Protein 8.3 (H) 6.5 - 8.1 g/dL   Albumin 4.3 3.5 - 5.0 g/dL   AST 75 (H) 15 - 41 U/L   ALT 26 14 - 54 U/L   Alkaline Phosphatase 74 38 - 126 U/L   Total Bilirubin 4.9 (H) 0.3 - 1.2 mg/dL   GFR calc non Af Amer >60 >60 mL/min   GFR calc Af Amer >60 >60 mL/min    Comment: (NOTE) The eGFR has been calculated using the CKD EPI equation. This calculation has not been validated in all clinical situations. eGFR's persistently <60 mL/min  signify possible Chronic Kidney Disease.    Anion gap 7 5 - 15    Comment: Performed at [facility], [place] [address], [place], [place] [address]  Reticulocytes     Status: Abnormal   Collection Time: 05/10/17 10:56 AM  Result Value Ref Range   Retic Ct Pct 13.4 (H) 0.4 - 3.1 %   RBC. 2.32 (L) 3.87 - 5.11 MIL/uL   Retic Count, Absolute 310.9 (H) 19.0 - 186.0 K/uL    Comment: Performed at [facility], [place] [address] [place], [place] [address]  I-Stat Troponin, ED (not at [facility])     Status: None   Collection Time: 05/10/17 11:00 AM  Result Value Ref Range   Troponin i, poc 0.00 0.00 - 0.08 ng/mL   Comment 3            Comment: Due to the release kinetics of cTnI, a negative result within the first hours of the onset of symptoms does not rule out myocardial infarction with certainty. If myocardial infarction is still suspected, repeat the test at appropriate intervals.    Dg Chest 2 View  Result Date: 05/10/2017 CLINICAL DATA:  Fever and cough for 1 day EXAM: CHEST - 2 VIEW COMPARISON:  11/25/2016 FINDINGS: Cardiac shadow is stable. Persistent scarring is noted particularly in the apices bilaterally. No focal infiltrate or sizable effusion is seen. No bony abnormality is noted. IMPRESSION: Chronic scarring particularly in the apices bilaterally. No acute abnormality noted. Electronically Signed   By: Inez Catalina M.D.   On: 05/10/2017 10:41    Pending Labs Unresulted Labs (From admission, onward)   None      Vitals/Pain Today's Vitals   05/10/17 1230 05/10/17 1256 05/10/17 1300 05/10/17 1330  BP: (!) 81/41  (!) 90/42 (!) 91/57  Pulse: 93  80 74  Resp: _0 Temp: 99.3 F (37.4 C)     TempSrc: Oral     SpO2: 97%  97% 97%  PainSc: 8  8       Isolation Precautions Droplet precaution  Medications Medications  diphenhydrAMINE (BENADRYL) capsule 25-50 mg (25 mg Oral Given 05/10/17 1103)  ondansetron (ZOFRAN) injection 4 mg (4 mg  Intravenous Given 05/10/17 1103)  0.45 % sodium chloride infusion ( Intravenous New Bag/Given  05/10/17 1103)  albuterol (PROVENTIL) (2.5 MG/3ML) 0.083% nebulizer solution 5 mg (not administered)  oseltamivir (TAMIFLU) capsule 75 mg (not administered)  sodium chloride 0.9 % bolus 500 mL (not administered)  HYDROmorphone (DILAUDID) tablet 2 mg (not administered)  ketorolac (TORADOL) 15 MG/ML injection 15 mg (15 mg Intravenous Given 05/10/17 1103)  HYDROmorphone (DILAUDID) injection 0.5 mg (0.5 mg Intravenous Given 05/10/17 1103)    Or  HYDROmorphone (DILAUDID) injection 0.5 mg ( Subcutaneous See Alternative 05/10/17 1103)  albuterol (PROVENTIL) (2.5 MG/3ML) 0.083% nebulizer solution 5 mg (5 mg Nebulization Given 05/10/17 1108)  acetaminophen (TYLENOL) tablet 650 mg (650 mg Oral Given 05/10/17 1119)  sodium chloride 0.9 % bolus 500 mL (0 mLs Intravenous Stopped 05/10/17 1338)    Mobility walks

## 2017-05-10 NOTE — ED Notes (Signed)
Pt complains of fever, cough, and back pain since earlier today

## 2017-05-10 NOTE — ED Notes (Signed)
Bed: WA07 Expected date:  Expected time:  Means of arrival:  Comments: 

## 2017-05-11 DIAGNOSIS — G43909 Migraine, unspecified, not intractable, without status migrainosus: Secondary | ICD-10-CM | POA: Diagnosis present

## 2017-05-11 MED ORDER — LIP MEDEX EX OINT
TOPICAL_OINTMENT | CUTANEOUS | Status: AC
  Administered 2017-05-11: 18:00:00
  Filled 2017-05-11: qty 7

## 2017-05-11 MED ORDER — LORAZEPAM 0.5 MG PO TABS
0.5000 mg | ORAL_TABLET | Freq: Once | ORAL | Status: AC
  Administered 2017-05-11: 0.5 mg via ORAL
  Filled 2017-05-11: qty 1

## 2017-05-11 MED ORDER — ONDANSETRON HCL 4 MG/2ML IJ SOLN
4.0000 mg | Freq: Four times a day (QID) | INTRAMUSCULAR | Status: DC | PRN
  Administered 2017-05-11 – 2017-05-15 (×8): 4 mg via INTRAVENOUS
  Filled 2017-05-11 (×9): qty 2

## 2017-05-11 NOTE — Progress Notes (Signed)
SICKLE CELL SERVICE PROGRESS NOTE  Valerie Reid WJX:[id number] DOB: 25-Oct-1990 DOA: 05/10/2017 PCP: Ledon Snare, MD  Assessment/Plan: Active Problems:   Functional asplenia   Influenza A   Hb-SS disease with vaso-occlusive crisis (HCC)  1. Influenza: Continue on Tamiflu. 2. Hb SS with Crisis: Currently the patient is not on the PCA as the pain associated with her headache from the influenza is such that she is to use the PCA effectively.  However she does report her pain is improved.  We will continue her oral medications as tolerated with bolus doses of IV Dilaudid as needed.  Continue IV Toradol and IV fluids. 3. Hypoxia: Patient continues to have some degree of hypoxia likely due to atelectasis. 4. SIRS/Fever: Due to the influenza infection.  However has been some clinical improvement.   5. Headache: The patient has a history of migraine headache and is currently having migraine headaches.  She is unable to receive the usual headache medications due to the intracranial pathology.  Thus I am treating her with a headache cocktail of IV Benadryl, IV Reglan and IV Toradol. 6. DVT Prophylaxis: On Lovenox.  Code Status: Full Code Family Communication: N/A Disposition Plan: Not yet ready for discharge  MATTHEWS,MICHELLE A.  Pager [phone]. If 7PM-7AM, please contact night-coverage.  05/11/2017, 2:27 PM  LOS: 1 day   Interim History: Patient reports that today she feels that her pain is improved since admission yesterday.  Unable to rate the intensity of the pain but she mostly complains of the headache rather than pain in any other area of her body.  She has had 2 episodes of emesis which is common for her during the times of severe migraines.  Consultants:  None  Procedures:  None  Antibiotics:  Tamiflu 3/7 >>    Objective: Vitals:   05/10/17 2149 05/11/17 0520 05/11/17 1116 05/11/17 1120  BP:  (!) 92/45    Pulse:  93    Resp:  16    Temp:  99.5 F (37.5 C)     TempSrc:  Oral    SpO2: 97% 100% 98% 98%  Weight:      Height:       Weight change:   Intake/Output Summary (Last 24 hours) at 05/11/2017 1427 Last data filed at 05/11/2017 1200 Gross per 24 hour  Intake 1395 ml  Output 1451 ml  Net -56 ml      Physical Exam General: Alert, awake, oriented x3, in severe distress due to headache which is aggravated by light. HEENT: [place]/AT PEERL, EOMI, anicteric Neck: Trachea midline,  no masses, no thyromegal,y no JVD, no carotid bruit.  Her neck is supple and her Kernig's and Brudzinski sign are both negative. OROPHARYNX:  Moist, No exudate/ erythema/lesions.  Heart: Regular rate and rhythm, without murmurs, rubs, gallops, PMI non-displaced, no heaves or thrills on palpation.  Lungs: Clear to auscultation, no wheezing or rhonchi noted. No increased vocal fremitus resonant to percussion  Abdomen: Soft, nontender, nondistended, positive bowel sounds, no masses no hepatosplenomegaly noted.  Neuro: No focal neurological deficits noted cranial nerves II through XII grossly intact.  Unable to test for strength and she is unable to cooperate with the examination due to her headache.  However she moves all extremities against gravity. Musculoskeletal: No warmth swelling or erythema around joints, no spinal tenderness noted. Psychiatric: Patient alert and oriented x 3, good insight and cognition, good recent to remote recall.    Data Reviewed: Basic Metabolic Panel: Recent Labs  Lab 05/10/17 1056  NA 136  K 3.7  CL 107  CO2 22  GLUCOSE 105*  BUN <5*  CREATININE 0.34*  CALCIUM 8.7*   Liver Function Tests: Recent Labs  Lab 05/10/17 1056  AST 75*  ALT 26  ALKPHOS 74  BILITOT 4.9*  PROT 8.3*  ALBUMIN 4.3   No results for input(s): LIPASE, AMYLASE in the last 168 hours. No results for input(s): AMMONIA in the last 168 hours. CBC: Recent Labs  Lab 05/10/17 1056  WBC 9.9  HGB 7.0*  HCT 20.8*  MCV 89.7  PLT 356   Cardiac Enzymes: No  results for input(s): CKTOTAL, CKMB, CKMBINDEX, TROPONINI in the last 168 hours. BNP (last 3 results) No results for input(s): BNP in the last 8760 hours.  ProBNP (last 3 results) No results for input(s): PROBNP in the last 8760 hours.  CBG: No results for input(s): GLUCAP in the last 168 hours.  No results found for this or any previous visit (from the past 240 hour(s)).   Studies: Dg Chest 2 View  Result Date: 05/10/2017 CLINICAL DATA:  Fever and cough for 1 day EXAM: CHEST - 2 VIEW COMPARISON:  11/25/2016 FINDINGS: Cardiac shadow is stable. Persistent scarring is noted particularly in the apices bilaterally. No focal infiltrate or sizable effusion is seen. No bony abnormality is noted. IMPRESSION: Chronic scarring particularly in the apices bilaterally. No acute abnormality noted. Electronically Signed   By: Alcide CleverMark  Lukens M.D.   On: 05/10/2017 10:41    Scheduled Meds: . albuterol  2.5 mg Nebulization BID  . budesonide  0.25 mg Nebulization BID  . enoxaparin (LOVENOX) injection  40 mg Subcutaneous Q24H  . folic acid  1 mg Oral Daily  . HYDROmorphone  6 mg Oral Q4H  . hydroxyurea  1,000 mg Oral Daily  . ketorolac  30 mg Intravenous Q6H  . oseltamivir  75 mg Oral BID   Continuous Infusions: . sodium chloride 150 mL/hr at 05/11/17 0800    Active Problems:   Functional asplenia   Influenza A   Hb-SS disease with vaso-occlusive crisis (HCC)    In excess of 35 minutes spent during this visit. Greater than 50% involved face to face contact with the patient for assessment, counseling and coordination of care.

## 2017-05-12 DIAGNOSIS — D638 Anemia in other chronic diseases classified elsewhere: Secondary | ICD-10-CM

## 2017-05-12 DIAGNOSIS — G43019 Migraine without aura, intractable, without status migrainosus: Secondary | ICD-10-CM

## 2017-05-12 LAB — CBC WITH DIFFERENTIAL/PLATELET
BAND NEUTROPHILS: 4 %
BASOS PCT: 0 %
BLASTS: 0 %
Basophils Absolute: 0 10*3/uL (ref 0.0–0.1)
EOS ABS: 0 10*3/uL (ref 0.0–0.7)
Eosinophils Relative: 0 %
HEMATOCRIT: 17 % — AB (ref 36.0–46.0)
Hemoglobin: 6.1 g/dL — CL (ref 12.0–15.0)
LYMPHS PCT: 35 %
Lymphs Abs: 3.4 10*3/uL (ref 0.7–4.0)
MCH: 31 pg (ref 26.0–34.0)
MCHC: 35.9 g/dL (ref 30.0–36.0)
MCV: 86.3 fL (ref 78.0–100.0)
MONO ABS: 0.4 10*3/uL (ref 0.1–1.0)
MONOS PCT: 4 %
Metamyelocytes Relative: 0 %
Myelocytes: 0 %
NEUTROS ABS: 5.8 10*3/uL (ref 1.7–7.7)
NEUTROS PCT: 57 %
OTHER: 0 %
Platelets: 231 10*3/uL (ref 150–400)
Promyelocytes Absolute: 0 %
RBC: 1.97 MIL/uL — ABNORMAL LOW (ref 3.87–5.11)
RDW: 25.8 % — ABNORMAL HIGH (ref 11.5–15.5)
WBC: 9.6 10*3/uL (ref 4.0–10.5)
nRBC: 0 /100 WBC

## 2017-05-12 LAB — RETICULOCYTES
RBC.: 1.97 MIL/uL — AB (ref 3.87–5.11)
Retic Count, Absolute: 218.7 10*3/uL — ABNORMAL HIGH (ref 19.0–186.0)
Retic Ct Pct: 11.1 % — ABNORMAL HIGH (ref 0.4–3.1)

## 2017-05-12 MED ORDER — HYDROMORPHONE 1 MG/ML IV SOLN
INTRAVENOUS | Status: DC
  Administered 2017-05-12: 14:00:00 via INTRAVENOUS
  Administered 2017-05-12 – 2017-05-13 (×3): 0.6 mg via INTRAVENOUS
  Administered 2017-05-13: 2.1 mg via INTRAVENOUS
  Administered 2017-05-13: 0.6 mg via INTRAVENOUS
  Filled 2017-05-12: qty 25

## 2017-05-12 MED ORDER — NALOXONE HCL 0.4 MG/ML IJ SOLN: 0.4000 mg | INTRAMUSCULAR | Status: DC | PRN

## 2017-05-12 MED ORDER — SODIUM CHLORIDE 0.9% FLUSH: 9.0000 mL | INTRAVENOUS | Status: DC | PRN

## 2017-05-12 NOTE — Progress Notes (Signed)
SICKLE CELL SERVICE PROGRESS NOTE  Valerie Reid JYN:[id number] DOB: [birth year] DOA: 05/10/2017 PCP: Ledon Snare, MD  Assessment/Plan: Active Problems:   Functional asplenia   Influenza A   Hb-SS disease with vaso-occlusive crisis (HCC)  1. Influenza A: Continue the Tamiflu 2. Hb SS with Crisis: Started on Dilaudid PCA. Continue Toradol. Will increase fluids to 50 ml/hr as patient still eating and drinking poorly.  3. Migraine HA: Patient is responding to headache cocktail was however is wearing off before she is able to receive another headache cocktail. 4. Emesis: Emesis persists as the headaches intensify.  With the headaches will length and she has a reprieve from the emesis. 5. SIRS/Fever: Patient continues to have fevers however her white blood cell count is no longer elevated.  It was felt to be associated with both the influenza and with her sickle cell crisis.  We will continue to monitor the patient  Code Status: Full Code Family Communication: N/A Disposition Plan: Not yet ready for discharge  Cru Kritikos A.  Pager [phone]. If 7PM-7AM, please contact night-coverage.  05/12/2017, 5:14 PM  LOS: 2 days   Interim History: The patient reports pain in her back at 7/10 in a headache of 7/10.  She is more awake and alert today.  And thinks that she can start on the PCA.    Consultants:  None  Procedures:  None  Antibiotics:  Tamiflu 3/7 >>    Objective: Vitals:   05/11/17 2110 05/12/17 0605 05/12/17 1323 05/12/17 1409  BP: (!) 97/58 99/65 (!) 93/44   Pulse: 79 95 60   Resp: 18 18 18 18   Temp: 98.1 F (36.7 C) 98.4 F (36.9 C) 97.9 F (36.6 C)   TempSrc: Oral Oral Oral   SpO2: 99% 99% 100% 98%  Weight:      Height:       Weight change:   Intake/Output Summary (Last 24 hours) at 05/12/2017 1714 Last data filed at 05/12/2017 0500 Gross per 24 hour  Intake -  Output 1400 ml  Net -1400 ml      Physical Exam General: Alert, awake, oriented  x3, in no acute distress.  HEENT: [place]/AT PEERL, EOMI, mild icterus Neck: Trachea midline,  no masses, no thyromegal,y no JVD, no carotid bruit OROPHARYNX:  Moist, No exudate/ erythema/lesions.  Heart: Regular rate and rhythm, without murmurs, rubs, gallops, PMI non-displaced, no heaves or thrills on palpation.  Lungs: Clear to auscultation, no wheezing or rhonchi noted. No increased vocal fremitus resonant to percussion  Abdomen: Soft, nontender, nondistended, positive bowel sounds, no masses no hepatosplenomegaly noted  Neuro: No focal neurological deficits noted cranial nerves II through XII grossly intact. . Strength at baseline in bilateral upper and lower extremities. Musculoskeletal: No warmth swelling or erythema around joints, no spinal tenderness noted. Psychiatric: Patient alert and oriented x3, good insight and cognition, good recent to remote recall.    Data Reviewed: Basic Metabolic Panel: Recent Labs  Lab 05/10/17 1056  NA 136  K 3.7  CL 107  CO2 22  GLUCOSE 105*  BUN <5*  CREATININE 0.34*  CALCIUM 8.7*   Liver Function Tests: Recent Labs  Lab 05/10/17 1056  AST 75*  ALT 26  ALKPHOS 74  BILITOT 4.9*  PROT 8.3*  ALBUMIN 4.3   No results for input(s): LIPASE, AMYLASE in the last 168 hours. No results for input(s): AMMONIA in the last 168 hours. CBC: Recent Labs  Lab 05/10/17 1056 05/12/17 1324  WBC 9.9 9.6  NEUTROABS  --  5.8  HGB 7.0* 6.1*  HCT 20.8* 17.0*  MCV 89.7 86.3  PLT 356 231   Cardiac Enzymes: No results for input(s): CKTOTAL, CKMB, CKMBINDEX, TROPONINI in the last 168 hours. BNP (last 3 results) No results for input(s): BNP in the last 8760 hours.  ProBNP (last 3 results) No results for input(s): PROBNP in the last 8760 hours.  CBG: No results for input(s): GLUCAP in the last 168 hours.  Recent Results (from the past 240 hour(s))  Culture, blood (Routine X 2) w Reflex to ID Panel     Status: None (Preliminary result)   Collection  Time: 05/10/17  6:29 PM  Result Value Ref Range Status   Specimen Description   Final    BLOOD RIGHT ARM Performed at [facility], [address] [place], [place] [address]    Special Requests   Final    BAA BCAV Performed at [facility], [address] [place], [place] [address]    Culture   Final    NO GROWTH 2 DAYS Performed at [facility], [address] [place], [place] [address]    Report Status PENDING  Incomplete  Culture, blood (Routine X 2) w Reflex to ID Panel     Status: None (Preliminary result)   Collection Time: 05/10/17  6:29 PM  Result Value Ref Range Status   Specimen Description   Final    BLOOD RIGHT ARM Performed at [facility], [address] [place], [place] [address]    Special Requests   Final    BAA BCAV Performed at [facility], [address] [place], [place] [address]    Culture   Final    NO GROWTH 2 DAYS Performed at [facility], [address] [place], [place] [address]    Report Status PENDING  Incomplete     Studies: Dg Chest 2 View  Result Date: 05/10/2017 CLINICAL DATA:  Fever and cough for 1 day EXAM: CHEST - 2 VIEW COMPARISON:  11/25/2016 FINDINGS: Cardiac shadow is stable. Persistent scarring is noted particularly in the apices bilaterally. No focal infiltrate or sizable effusion is seen. No bony abnormality is noted. IMPRESSION: Chronic scarring particularly in the apices bilaterally. No acute abnormality noted. Electronically Signed   By: Alcide CleverMark  Lukens M.D.   On: 05/10/2017 10:41    Scheduled Meds: . albuterol  2.5 mg Nebulization BID  . budesonide  0.25 mg Nebulization BID  . enoxaparin (LOVENOX) injection  40 mg Subcutaneous Q24H  . folic acid  1 mg Oral Daily  . HYDROmorphone   Intravenous Q4H  . hydroxyurea  1,000 mg Oral Daily  . ketorolac  30 mg Intravenous Q6H  . oseltamivir  75 mg Oral BID   Continuous Infusions: . sodium  chloride 10 mL/hr at 05/12/17 1232    Active Problems:   Functional asplenia   Influenza A   Hb-SS disease with vaso-occlusive crisis (HCC)    In excess of 25 minutes spent during this visit. Greater than 50% involved face to face contact with the patient for assessment, counseling and coordination of care.

## 2017-05-12 NOTE — Progress Notes (Signed)
CRITICAL VALUE ALERT  Critical Value:  HGB 6.1   Date & Time Notied:  05/12/2017 2:05 PM   Provider Notified: Dr Ashley Royaltymatthews   Orders Received/Actions taken:

## 2017-05-13 DIAGNOSIS — G43909 Migraine, unspecified, not intractable, without status migrainosus: Secondary | ICD-10-CM

## 2017-05-13 DIAGNOSIS — K5900 Constipation, unspecified: Secondary | ICD-10-CM | POA: Diagnosis present

## 2017-05-13 DIAGNOSIS — R7989 Other specified abnormal findings of blood chemistry: Secondary | ICD-10-CM

## 2017-05-13 DIAGNOSIS — R778 Other specified abnormalities of plasma proteins: Secondary | ICD-10-CM | POA: Diagnosis not present

## 2017-05-13 DIAGNOSIS — R1110 Vomiting, unspecified: Secondary | ICD-10-CM | POA: Diagnosis not present

## 2017-05-13 DIAGNOSIS — R748 Abnormal levels of other serum enzymes: Secondary | ICD-10-CM

## 2017-05-13 LAB — CBC WITH DIFFERENTIAL/PLATELET
BASOS ABS: 0 10*3/uL (ref 0.0–0.1)
Basophils Relative: 0 %
EOS ABS: 0 10*3/uL (ref 0.0–0.7)
Eosinophils Relative: 0 %
HCT: 15.1 % — ABNORMAL LOW (ref 36.0–46.0)
HEMOGLOBIN: 5.5 g/dL — AB (ref 12.0–15.0)
LYMPHS ABS: 2.5 10*3/uL (ref 0.7–4.0)
LYMPHS PCT: 24 %
MCH: 30.4 pg (ref 26.0–34.0)
MCHC: 36.4 g/dL — AB (ref 30.0–36.0)
MCV: 83.4 fL (ref 78.0–100.0)
MONOS PCT: 6 %
Monocytes Absolute: 0.6 10*3/uL (ref 0.1–1.0)
Neutro Abs: 7.2 10*3/uL (ref 1.7–7.7)
Neutrophils Relative %: 70 %
PLATELETS: 214 10*3/uL (ref 150–400)
RBC: 1.81 MIL/uL — AB (ref 3.87–5.11)
RDW: 25.8 % — ABNORMAL HIGH (ref 11.5–15.5)
WBC MORPHOLOGY: INCREASED
WBC: 10.3 10*3/uL (ref 4.0–10.5)

## 2017-05-13 LAB — TROPONIN I
Troponin I: 0.12 ng/mL (ref <0.03)
Troponin I: 0.15 ng/mL (ref <0.03)

## 2017-05-13 LAB — RETICULOCYTES
RBC.: 1.81 MIL/uL — ABNORMAL LOW (ref 3.87–5.11)
Retic Count, Absolute: 242.5 10*3/uL — ABNORMAL HIGH (ref 19.0–186.0)
Retic Ct Pct: 13.4 % — ABNORMAL HIGH (ref 0.4–3.1)

## 2017-05-13 MED ORDER — HYDROMORPHONE HCL 2 MG PO TABS
4.0000 mg | ORAL_TABLET | ORAL | Status: DC | PRN
  Administered 2017-05-13 – 2017-05-15 (×4): 4 mg via ORAL
  Filled 2017-05-13: qty 2
  Filled 2017-05-13: qty 1
  Filled 2017-05-13: qty 2
  Filled 2017-05-13: qty 1
  Filled 2017-05-13: qty 2

## 2017-05-13 MED ORDER — SODIUM CHLORIDE 0.9 % IV SOLN: Freq: Once | INTRAVENOUS | Status: DC

## 2017-05-13 MED ORDER — ASPIRIN 81 MG PO CHEW
81.0000 mg | CHEWABLE_TABLET | Freq: Once | ORAL | Status: AC
  Administered 2017-05-13: 81 mg via ORAL
  Filled 2017-05-13: qty 1

## 2017-05-13 NOTE — Progress Notes (Signed)
SICKLE CELL SERVICE PROGRESS NOTE  Valerie Reid BJY:[id number] DOB: [birth year] DOA: 05/10/2017 PCP: Valerie SnareFarland, Sandra, MD  Assessment/Plan: Active Problems:   Functional asplenia   Influenza A   Hb-SS disease with vaso-occlusive crisis (HCC)  1. Chest Pain:Pt likely has a period of anxiety. Troponin checked and found to be elevated. However the pain has resolved. EKG shows no ischemic changes. If subsequent Troponin results continue to escalate would move to telemetry bed and consult Cardiology.  2. Hb SS with Crisis: Pt has had minimal use of the PCA and the IV Dilaudid is making her HA worse. Will discontinue PCA and schedule oral Dilaudid with IV Dilaudid for breakthrough. 3. Influenza A: Continue Tamiflu 4. Anemia of Chronic Disease: In light of elevated Troponin and chest wall pain will check Hb and reticulocyte. Continue Hydrea for now.  5. Migraine HA: Pt's HA's worsened by IV Dilaudid. 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 Will continue HA cocktails of Reglan, Toradol and Benadryl.  Code Status: Full Code Family Communication: N/A Disposition Plan: Not yet ready for discharge  Valerie Reid A.  Pager [phone]. If 7PM-7AM, please contact night-coverage.  05/13/2017, 7:54 PM  LOS: 3 days   Interim History: Pt states that the sickle cell pain is minimal. However her HA is her biggest problem. She has used only 4.5 mgm of Dilaudid with 25/15:demands/deliveries in the last 24 hours.   Consultants:  None  Procedures:  None  Antibiotics:  Tamiflu 3/7>>    Objective: Vitals:   05/13/17 1345 05/13/17 1417 05/13/17 1624 05/13/17 1847  BP:  (!) 104/45  (!) 96/39  Pulse:  72  (!) 111  Resp: 19 18 18 18   Temp:  98.8 F (37.1 C)  97.8 F (36.6 C)  TempSrc:  Oral  Oral  SpO2: 99% 97% 98% 95%  Weight:      Height:       Weight change:   Intake/Output Summary (Last 24 hours) at 05/13/2017 1954 Last data filed  at 05/13/2017 1230 Gross per 24 hour  Intake -  Output 1100 ml  Net -1100 ml      Physical Exam General: Alert, awake, oriented x3, in mild distress due to HA.  HEENT: [place]/AT PEERL, EOMI, anicteric Neck: Trachea midline,  no masses, no thyromegal,y no JVD, no carotid bruit OROPHARYNX:  Moist, No exudate/ erythema/lesions.  Heart: Regular rate and rhythm, without murmurs, rubs, gallops, PMI non-displaced, no heaves or thrills on palpation.  Lungs: Clear to auscultation, no wheezing or rhonchi noted. No increased vocal fremitus resonant to percussion  Abdomen: Soft, nontender, nondistended, positive bowel sounds, no masses no hepatosplenomegaly noted.  Neuro: No focal neurological deficits noted cranial nerves II through XII grossly intact.  Strength at baseline in bilateral upper and lower extremities. Musculoskeletal: No warmth swelling or erythema around joints, no spinal tenderness noted. Psychiatric: Patient alert and oriented x3, good insight and cognition, good recent to remote recall.    Data Reviewed: Basic Metabolic Panel: Recent Labs  Lab 05/10/17 1056  NA 136  K 3.7  CL 107  CO2 22  GLUCOSE 105*  BUN <5*  CREATININE 0.34*  CALCIUM 8.7*   Liver Function Tests: Recent Labs  Lab 05/10/17 1056  AST 75*  ALT 26  ALKPHOS 74  BILITOT 4.9*  PROT 8.3*  ALBUMIN 4.3   No results for input(s): LIPASE, AMYLASE in the last 168 hours. No results for input(s): AMMONIA in the last 168 hours. CBC: Recent Labs  Lab 05/10/17 1056 05/12/17 1324 05/13/17 1803  WBC 9.9 9.6 10.3  NEUTROABS  --  5.8 7.2  HGB 7.0* 6.1* 5.5*  HCT 20.8* 17.0* 15.1*  MCV 89.7 86.3 83.4  PLT 356 231 214   Cardiac Enzymes: Recent Labs  Lab 05/13/17 1650  TROPONINI 0.12*   BNP (last 3 results) No results for input(s): BNP in the last 8760 hours.  ProBNP (last 3 results) No results for input(s): PROBNP in the last 8760 hours.  CBG: No results for input(s): GLUCAP in the last 168  hours.  Recent Results (from the past 240 hour(s))  Culture, blood (Routine X 2) w Reflex to ID Panel     Status: None (Preliminary result)   Collection Time: 05/10/17  6:29 PM  Result Value Ref Range Status   Specimen Description   Final    BLOOD RIGHT ARM Performed at [facility], [address] [place], [place] [address]    Special Requests   Final    BAA BCAV Performed at [facility], [address], [place], [place] [address]    Culture   Final    NO GROWTH 3 DAYS Performed at [facility], [address] [place], [place] [address]    Report Status PENDING  Incomplete  Culture, blood (Routine X 2) w Reflex to ID Panel     Status: None (Preliminary result)   Collection Time: 05/10/17  6:29 PM  Result Value Ref Range Status   Specimen Description   Final    BLOOD RIGHT ARM Performed at [facility], [address], [place], [place] [address]    Special Requests   Final    BAA BCAV Performed at [facility], [address] [place], [place] [address]    Culture   Final    NO GROWTH 3 DAYS Performed at [facility], [address], [place], [place] [address]    Report Status PENDING  Incomplete     Studies: Dg Chest 2 View  Result Date: 05/10/2017 CLINICAL DATA:  Fever and cough for 1 day EXAM: CHEST - 2 VIEW COMPARISON:  11/25/2016 FINDINGS: Cardiac shadow is stable. Persistent scarring is noted particularly in the apices bilaterally. No focal infiltrate or sizable effusion is seen. No bony abnormality is noted. IMPRESSION: Chronic scarring particularly in the apices bilaterally. No acute abnormality noted. Electronically Signed   By: Alcide Clever M.D.   On: 05/10/2017 10:41    Scheduled Meds: . albuterol  2.5 mg Nebulization BID  . budesonide  0.25 mg Nebulization BID  . enoxaparin (LOVENOX) injection  40 mg Subcutaneous Q24H  . folic acid  1 mg Oral Daily  . HYDROmorphone    Intravenous Q4H  . hydroxyurea  1,000 mg Oral Daily  . ketorolac  30 mg Intravenous Q6H  . oseltamivir  75 mg Oral BID   Continuous Infusions: . sodium chloride 10 mL/hr at 05/12/17 1232  . sodium chloride      Active Problems:   Functional asplenia   Influenza A   Hb-SS disease with vaso-occlusive crisis (HCC)    In excess of 40 minutes spent during this visit. Greater than 50% involved face to face contact with the patient for assessment, counseling and coordination of care.

## 2017-05-13 NOTE — Progress Notes (Signed)
CRITICAL VALUE ALERT  Critical Value:  Troponin 0.12   Date & Time Notied:  05/13/2017 5:38 PM    Provider Notified: Dr Ashley Royaltymatthews  Orders Received/Actions taken:

## 2017-05-13 NOTE — Progress Notes (Signed)
Patient called out with complaints of chest pain and feeling like her heart was beating fats. Patient stated pain right over her heart.  Pulse rate elevated at 117 bpm. MD notified, and order for ekg and troponin levels obtained in addition to PO dilaudid and one time order for aspirin.  Medications administered per order and EKG obtained and placed on chart.   1745- critical lab called to RN for troponin level of 0.12.  Pt reports resolution of pain in her chest and no further discomfort.  Pulse down to 100 bpm. MD aware.  Will continue to monitor.

## 2017-05-13 NOTE — Progress Notes (Signed)
PHARMACY BRIEF NOTE: HYDROXYUREA   By [facility] policy, hydroxyurea is automatically held when any of the following laboratory values occur:  ANC < 2 K  Pltc < 80K in sickle-cell patients; < 100K in other patients  Hgb <= 6 in sickle-cell patients (hgb 5.5); < 8 in other patients  Reticulocytes < 80K when Hgb < 9  Hydroxyurea has been held (discontinued from profile) per policy.   Dorna LeitzAnh Fabrizio Filip, PharmD, BCPS 05/13/2017 9:51 PM

## 2017-05-14 ENCOUNTER — Inpatient Hospital Stay (HOSPITAL_COMMUNITY): Payer: Self-pay

## 2017-05-14 DIAGNOSIS — R000 Tachycardia, unspecified: Secondary | ICD-10-CM

## 2017-05-14 DIAGNOSIS — G43009 Migraine without aura, not intractable, without status migrainosus: Secondary | ICD-10-CM

## 2017-05-14 DIAGNOSIS — R002 Palpitations: Secondary | ICD-10-CM

## 2017-05-14 DIAGNOSIS — R011 Cardiac murmur, unspecified: Secondary | ICD-10-CM

## 2017-05-14 LAB — HEMOGLOBIN AND HEMATOCRIT, BLOOD
HEMATOCRIT: 19.6 % — AB (ref 36.0–46.0)
HEMOGLOBIN: 6.9 g/dL — AB (ref 12.0–15.0)

## 2017-05-14 LAB — TROPONIN I
TROPONIN I: 0.14 ng/mL — AB (ref <0.03)
TROPONIN I: 0.12 ng/mL — AB (ref <0.03)
TROPONIN I: 0.12 ng/mL — AB (ref <0.03)
Troponin I: 0.14 ng/mL (ref <0.03)

## 2017-05-14 LAB — PREPARE RBC (CROSSMATCH)

## 2017-05-14 MED ORDER — DEXTROSE-NACL 5-0.9 % IV SOLN: INTRAVENOUS | Status: DC

## 2017-05-14 MED ORDER — KETOROLAC TROMETHAMINE 30 MG/ML IJ SOLN
15.0000 mg | Freq: Four times a day (QID) | INTRAMUSCULAR | Status: DC
  Administered 2017-05-14 – 2017-05-15 (×3): 15 mg via INTRAVENOUS
  Filled 2017-05-14 (×3): qty 1

## 2017-05-14 MED ORDER — SODIUM CHLORIDE 0.9 % IV SOLN
Freq: Once | INTRAVENOUS | Status: AC
  Administered 2017-05-14: 13:00:00 via INTRAVENOUS

## 2017-05-14 MED ORDER — FOLIC ACID 1 MG PO TABS
2.0000 mg | ORAL_TABLET | Freq: Every day | ORAL | Status: DC
  Administered 2017-05-15 – 2017-05-16 (×2): 2 mg via ORAL
  Filled 2017-05-14 (×2): qty 2

## 2017-05-14 NOTE — Progress Notes (Signed)
Patient transferred to 5east for telemetry monitoring per MD request. Report given to receiving RN.

## 2017-05-14 NOTE — Progress Notes (Signed)
SICKLE CELL SERVICE PROGRESS NOTE  Valerie MaduraYaneth Reid ZOX:[id number] DOB: 1991-02-01 DOA: 05/10/2017 PCP: Ledon SnareFarland, Sandra, MD  Assessment/Plan: Active Problems:   Functional asplenia   Influenza A   Hb-SS disease with vaso-occlusive crisis (HCC)  1. Palpitations and Tachycardia:Pt having chest pain which is intermittent. However Troponin remains elevated. Will obtain 12 lead EKG. Start on ASA and a B-blocker. Obtain 2D-ECHO and continue serial enzymes. Transfuse 1 unit RBC's. Supplement with Oxygen. 2. Hypoxia: Etiology unclear. Will obtain CXR to evaluate for Acute Chest Syndrome. Continue Oxygen Supplementation.  3. Heart Murmur: It appears more prominent that in the past several days. This may be due to the tachycardia and low Hb. 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  4. Hb SS with Crisis: Continue oral Dilaudid as IV Dilaudid via the PCA is causing severe migraine headaches.  5. Influenza A: Continue Tamiflu until tomorrow.  6. Anemia of Chronic Disease: In light of elevated Troponin and chest wall pain will transfuse 1 unit RBC's. Hydrea on hold for now,  7. Migraine HA: Pt's HA's worsened by IV Dilaudid. 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 Will continue HA cocktails of Reglan, Toradol and Benadryl.  Code Status: Full Code Family Communication: N/A Disposition Plan: Transfer to telemetry bed  [name] A.  Pager [phone]. If 7PM-7AM, please contact night-coverage.  05/14/2017, 12:16 PM  LOS: 4 days   Interim History: Pt states that she had pain up to 10/10 localized to the BLE's, back and chest. She also c/o palpitations and fast heartbeat. She received IV Dilaudid this morning and pain is now down to 7/10. She also has a frontal HA at an intensity of 10/10. She has had no further fevers.   Consultants:  None  Procedures:  None  Antibiotics:  Tamiflu  3/7>>   Objective: Vitals:   05/13/17 2114 05/14/17 0056 05/14/17 0523 05/14/17 1010  BP:  100/60 97/67 121/87  Pulse:  90 68 (!) 118  Resp:  12 14 16   Temp:  98.4 F (36.9 C) 98.4 F (36.9 C)   TempSrc:  Oral Oral   SpO2: 95% 92% 95% 97%  Weight:   49.1 kg (108 lb 3.9 oz)   Height:       Weight change: 0.7 kg (1 lb 8.7 oz)  Intake/Output Summary (Last 24 hours) at 05/14/2017 1216 Last data filed at 05/14/2017 1100 Gross per 24 hour  Intake -  Output 1100 ml  Net -1100 ml      Physical Exam General: Alert, awake, oriented x3, in mild distress due to HA.  HEENT: [place]/AT PEERL, EOMI, anicteric Neck: Trachea midline,  no masses, no thyromegal,y no JVD, no carotid bruit OROPHARYNX:  Moist, No exudate/ erythema/lesions.  Heart: Tachycardic with Regular rhythm. II/VI Systolic ejection murmur at base without radiation.  No rubs, gallops, PMI non-displaced, no heaves or thrills on palpation.  Lungs: Clear to auscultation, no wheezing or rhonchi noted. No increased vocal fremitus resonant to percussion  Abdomen: Soft, nontender, nondistended, positive bowel sounds, no masses no hepatosplenomegaly noted.  Neuro: No focal neurological deficits noted cranial nerves II through XII grossly intact.  Strength at baseline in bilateral upper and lower extremities. Musculoskeletal: No warmth swelling or erythema around joints, no spinal tenderness noted. Psychiatric: Patient alert and oriented x3, good insight and cognition, good recent to remote recall.    Data Reviewed: Basic Metabolic Panel: Recent Labs  Lab 05/10/17 1056  NA 136  K 3.7  CL 107  CO2 22  GLUCOSE 105*  BUN <5*  CREATININE 0.34*  CALCIUM 8.7*   Liver Function Tests: Recent Labs  Lab 05/10/17 1056  AST 75*  ALT 26  ALKPHOS 74  BILITOT 4.9*  PROT 8.3*  ALBUMIN 4.3   No results for input(s): LIPASE, AMYLASE in the last 168 hours. No results for input(s): AMMONIA in the last 168 hours. CBC: Recent Labs   Lab 05/10/17 1056 05/12/17 1324 05/13/17 1803  WBC 9.9 9.6 10.3  NEUTROABS  --  5.8 7.2  HGB 7.0* 6.1* 5.5*  HCT 20.8* 17.0* 15.1*  MCV 89.7 86.3 83.4  PLT 356 231 214   Cardiac Enzymes: Recent Labs  Lab 05/13/17 1650 05/13/17 2254 05/14/17 0420 05/14/17 1007  TROPONINI 0.12* 0.15* 0.14* 0.14*   BNP (last 3 results) No results for input(s): BNP in the last 8760 hours.  ProBNP (last 3 results) No results for input(s): PROBNP in the last 8760 hours.  CBG: No results for input(s): GLUCAP in the last 168 hours.  Recent Results (from the past 240 hour(s))  Culture, blood (Routine X 2) w Reflex to ID Panel     Status: None (Preliminary result)   Collection Time: 05/10/17  6:29 PM  Result Value Ref Range Status   Specimen Description   Final    BLOOD RIGHT ARM Performed at [facility], [address] [place], [place] [address]    Special Requests   Final    BAA BCAV Performed at [facility], [address], [place], [place] [address]    Culture   Final    NO GROWTH 3 DAYS Performed at [facility], [address], [place], [place] [address]    Report Status PENDING  Incomplete  Culture, blood (Routine X 2) w Reflex to ID Panel     Status: None (Preliminary result)   Collection Time: 05/10/17  6:29 PM  Result Value Ref Range Status   Specimen Description   Final    BLOOD RIGHT ARM Performed at [facility], [address], [place], [place] [address]    Special Requests   Final    BAA BCAV Performed at [facility], [address], [place], [place] [address]    Culture   Final    NO GROWTH 3 DAYS Performed at [facility], [address] [place], [place] [address]    Report Status PENDING  Incomplete     Studies: Dg Chest 2 View  Result Date: 05/10/2017 CLINICAL DATA:  Fever and cough for 1 day EXAM: CHEST - 2 VIEW COMPARISON:  11/25/2016 FINDINGS: Cardiac shadow is  stable. Persistent scarring is noted particularly in the apices bilaterally. No focal infiltrate or sizable effusion is seen. No bony abnormality is noted. IMPRESSION: Chronic scarring particularly in the apices bilaterally. No acute abnormality noted. Electronically Signed   By: Alcide Clever M.D.   On: 05/10/2017 10:41    Scheduled Meds: . albuterol  2.5 mg Nebulization BID  . budesonide  0.25 mg Nebulization BID  . enoxaparin (LOVENOX) injection  40 mg Subcutaneous Q24H  . folic acid  1 mg Oral Daily  . ketorolac  30 mg Intravenous Q6H  . oseltamivir  75 mg Oral BID   Continuous Infusions: . sodium chloride 10 mL/hr at 05/12/17 1232  . sodium chloride    . sodium chloride      Active Problems:  Functional asplenia   Influenza A   Hb-SS disease with vaso-occlusive crisis (HCC)    In excess of 40 minutes spent during this visit. Greater than 50% involved face to face contact with the patient for assessment, counseling and coordination of care.

## 2017-05-15 ENCOUNTER — Inpatient Hospital Stay (HOSPITAL_COMMUNITY): Payer: Self-pay

## 2017-05-15 DIAGNOSIS — R011 Cardiac murmur, unspecified: Secondary | ICD-10-CM

## 2017-05-15 LAB — BASIC METABOLIC PANEL
ANION GAP: 8 (ref 5–15)
CALCIUM: 8.5 mg/dL — AB (ref 8.9–10.3)
CO2: 28 mmol/L (ref 22–32)
Chloride: 105 mmol/L (ref 101–111)
Creatinine, Ser: 0.34 mg/dL — ABNORMAL LOW (ref 0.44–1.00)
GFR calc Af Amer: >60 mL/min (ref >60)
GFR calc non Af Amer: >60 mL/min (ref >60)
GLUCOSE: 102 mg/dL — AB (ref 65–99)
POTASSIUM: 3.5 mmol/L (ref 3.5–5.1)
SODIUM: 141 mmol/L (ref 135–145)

## 2017-05-15 LAB — CBC WITH DIFFERENTIAL/PLATELET
Basophils Absolute: 0 10*3/uL (ref 0.0–0.1)
Basophils Relative: 0 %
Eosinophils Absolute: 0 10*3/uL (ref 0.0–0.7)
Eosinophils Relative: 0 %
HCT: 19.7 % — ABNORMAL LOW (ref 36.0–46.0)
Hemoglobin: 6.7 g/dL — CL (ref 12.0–15.0)
LYMPHS PCT: 69 %
Lymphs Abs: 6.4 10*3/uL — ABNORMAL HIGH (ref 0.7–4.0)
MCH: 28.9 pg (ref 26.0–34.0)
MCHC: 34 g/dL (ref 30.0–36.0)
MCV: 84.9 fL (ref 78.0–100.0)
MONO ABS: 0.7 10*3/uL (ref 0.1–1.0)
MONOS PCT: 7 %
NEUTROS ABS: 2.3 10*3/uL (ref 1.7–7.7)
NRBC: 59 /100{WBCs} — AB
Neutrophils Relative %: 24 %
Platelets: 286 10*3/uL (ref 150–400)
RBC: 2.32 MIL/uL — AB (ref 3.87–5.11)
RDW: 25.2 % — AB (ref 11.5–15.5)
WBC: 9.4 10*3/uL (ref 4.0–10.5)

## 2017-05-15 LAB — TYPE AND SCREEN
ABO/RH(D): O POS
ANTIBODY SCREEN: NEGATIVE
Unit division: 0

## 2017-05-15 LAB — BPAM RBC
Blood Product Expiration Date: 201903192359
ISSUE DATE / TIME: 201903111317
Unit Type and Rh: 5100

## 2017-05-15 LAB — ECHOCARDIOGRAM COMPLETE
Height: 62 in
Weight: 1731.93 oz

## 2017-05-15 LAB — CULTURE, BLOOD (ROUTINE X 2)
Culture: NO GROWTH
Culture: NO GROWTH

## 2017-05-15 LAB — TROPONIN I: Troponin I: 0.08 ng/mL (ref <0.03)

## 2017-05-15 LAB — RETICULOCYTES
RBC.: 2.32 MIL/uL — ABNORMAL LOW (ref 3.87–5.11)
RETIC CT PCT: 10.3 % — AB (ref 0.4–3.1)
Retic Count, Absolute: 239 10*3/uL — ABNORMAL HIGH (ref 19.0–186.0)

## 2017-05-15 MED ORDER — KETOROLAC TROMETHAMINE 15 MG/ML IJ SOLN: 15.0000 mg | Freq: Four times a day (QID) | INTRAMUSCULAR | Status: DC | PRN

## 2017-05-15 MED ORDER — HYDROMORPHONE HCL 2 MG PO TABS: 4.0000 mg | ORAL_TABLET | ORAL | Status: DC

## 2017-05-15 MED ORDER — DIPHENHYDRAMINE HCL 50 MG/ML IJ SOLN
12.5000 mg | Freq: Four times a day (QID) | INTRAMUSCULAR | Status: DC | PRN
  Administered 2017-05-15 – 2017-05-16 (×3): 12.5 mg via INTRAVENOUS
  Filled 2017-05-15 (×3): qty 1

## 2017-05-15 MED ORDER — ALBUTEROL SULFATE (2.5 MG/3ML) 0.083% IN NEBU
2.5000 mg | INHALATION_SOLUTION | Freq: Two times a day (BID) | RESPIRATORY_TRACT | Status: DC
  Administered 2017-05-16: 2.5 mg via RESPIRATORY_TRACT
  Filled 2017-05-15: qty 3

## 2017-05-15 MED ORDER — KETOROLAC TROMETHAMINE 15 MG/ML IJ SOLN
15.0000 mg | Freq: Four times a day (QID) | INTRAMUSCULAR | Status: DC | PRN
  Administered 2017-05-15 – 2017-05-16 (×3): 15 mg via INTRAVENOUS
  Filled 2017-05-15 (×3): qty 1

## 2017-05-15 MED ORDER — OXYCODONE HCL 5 MG PO TABS
10.0000 mg | ORAL_TABLET | ORAL | Status: DC
  Administered 2017-05-15 – 2017-05-16 (×4): 10 mg via ORAL
  Filled 2017-05-15 (×5): qty 2

## 2017-05-15 NOTE — Progress Notes (Signed)
SICKLE CELL SERVICE PROGRESS NOTE  Valerie Reid UJW:[id number] DOB: 22-May-1990 DOA: 05/10/2017 PCP: Ledon SnareFarland, Sandra, MD  Assessment/Plan: Active Problems:   Functional asplenia   Influenza A   Hb-SS disease with vaso-occlusive crisis (HCC)  1. Palpitations and Tachycardia: Resolved after transfusion of 1 unit RBCs.  It is felt that this was secondary to volume depletion and low oxygen carrying capacity.  Echocardiogram showed normal systolic function essentially unremarkable architecture of the heart. 2. Hypoxia: Improved after transfusion.  Will encourage use of incentive spirometer and increase mobility. 3. Heart Murmur: Her murmur is almost inaudible after the transfusion.   4. Hb SS with Crisis: Unfortunately she is complaining of nausea and emesis with the oral Dilaudid as well as it being ineffective in managing her pain.  I will switch her over to oxycodone today.  Hemoglobin today is 6.7 g/dL. 5. Influenza A: Patient has completed Tamiflu 6. Anemia of Chronic Disease: Patient has received a transfusion of 1 unit of RBCs and posttransfusion hemoglobin is 6.7 g/dL.  He is currently being held. 7. Migraine HA: Pt's headaches were worsened by Dilaudid both oral and IV medications being changed oral oxycodone today.  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 Will continue HA cocktails of Reglan, Toradol and Benadryl.  Code Status: Full Code Family Communication: N/A Disposition Plan: Not yet ready for discharge  Isaias Dowson A.  Pager [phone]. If 7PM-7AM, please contact night-coverage.  05/15/2017, 5:14 PM  LOS: 5 days   Interim History: Pt states that the pain has significantly increased since yesterday.  She rates the pain 5/10 and localized to bilateral lower extremities.  With regard to her headache is worse less intense and not as frequent.  Consultants:  None  Procedures:  None  Antibiotics:  Tamiflu 3/7>>  3/11   Objective: Vitals:   05/14/17 2133 05/15/17 0529 05/15/17 0952 05/15/17 1509  BP: 106/61 94/60  (!) 94/47  Pulse: 77 66  86  Resp: 19 18  16   Temp: 98.2 F (36.8 C) 98.3 F (36.8 C)  98.2 F (36.8 C)  TempSrc: Oral Oral  Oral  SpO2: 92% 98% 96% (!) 84%  Weight:      Height:       Weight change:   Intake/Output Summary (Last 24 hours) at 05/15/2017 1714 Last data filed at 05/15/2017 1000 Gross per 24 hour  Intake 120 ml  Output 300 ml  Net -180 ml      Physical Exam General: Alert, awake, oriented x 3, in mild distress due to HA.  HEENT: [place]/AT PEERL, EOMI, anicteric Neck: Trachea midline,  no masses, no thyromegal,y no JVD, no carotid bruit OROPHARYNX:  Moist, No exudate/ erythema/lesions.  Heart: Tachycardic with Regular rhythm. II/VI Systolic ejection murmur at base without radiation.  No rubs, gallops, PMI non-displaced, no heaves or thrills on palpation.  Lungs: Clear to auscultation, no wheezing or rhonchi noted. No increased vocal fremitus resonant to percussion  Abdomen: Soft, nontender, nondistended, positive bowel sounds, no masses no hepatosplenomegaly noted.  Neuro: No focal neurological deficits noted cranial nerves II through XII grossly intact.  Strength at baseline in bilateral upper and lower extremities. Musculoskeletal: No warmth swelling or erythema around joints, no spinal tenderness noted. Psychiatric: Patient alert and oriented x3, good insight and cognition, good recent to remote recall.    Data Reviewed: Basic Metabolic Panel: Recent Labs  Lab 05/10/17 1056 05/15/17 1017  NA 136 141  K 3.7 3.5  CL 107 105  CO2 22 28  GLUCOSE 105* 102*  BUN <5* <5*  CREATININE 0.34* 0.34*  CALCIUM 8.7* 8.5*   Liver Function Tests: Recent Labs  Lab 05/10/17 1056  AST 75*  ALT 26  ALKPHOS 74  BILITOT 4.9*  PROT 8.3*  ALBUMIN 4.3   No results for input(s): LIPASE, AMYLASE in the last 168 hours. No results for input(s): AMMONIA in the last  168 hours. CBC: Recent Labs  Lab 05/10/17 1056 05/12/17 1324 05/13/17 1803 05/14/17 1727  WBC 9.9 9.6 10.3  --   NEUTROABS  --  5.8 7.2  --   HGB 7.0* 6.1* 5.5* 6.9*  HCT 20.8* 17.0* 15.1* 19.6*  MCV 89.7 86.3 83.4  --   PLT 356 231 214  --    Cardiac Enzymes: Recent Labs  Lab 05/14/17 0420 05/14/17 1007 05/14/17 1727 05/14/17 2215 05/15/17 1017  TROPONINI 0.14* 0.14* 0.12* 0.12* 0.08*   BNP (last 3 results) No results for input(s): BNP in the last 8760 hours.  ProBNP (last 3 results) No results for input(s): PROBNP in the last 8760 hours.  CBG: No results for input(s): GLUCAP in the last 168 hours.  Recent Results (from the past 240 hour(s))  Culture, blood (Routine X 2) w Reflex to ID Panel     Status: None   Collection Time: 05/10/17  6:29 PM  Result Value Ref Range Status   Specimen Description   Final    BLOOD RIGHT ARM Performed at [facility], [address], [place], [place] [address]    Special Requests   Final    BAA BCAV Performed at [facility], [address], [place], [place] [address]    Culture   Final    NO GROWTH 5 DAYS Performed at [facility], [address], [place], [place] [address]    Report Status 05/15/2017 FINAL  Final  Culture, blood (Routine X 2) w Reflex to ID Panel     Status: None   Collection Time: 05/10/17  6:29 PM  Result Value Ref Range Status   Specimen Description   Final    BLOOD RIGHT ARM Performed at [facility], [address] [place], [place] [address]    Special Requests   Final    BAA BCAV Performed at [facility], [address], [place], [place] [address]    Culture   Final    NO GROWTH 5 DAYS Performed at [facility], [address] [place], [place] [address]    Report Status 05/15/2017 FINAL  Final     Studies: Dg Chest 2 View  Result Date: 05/14/2017 CLINICAL DATA:  Hypoxia EXAM: CHEST - 2 VIEW  COMPARISON:  05/10/2017 FINDINGS: Cardiac shadow is stable. The lungs are well aerated bilaterally. Chronic scarring is noted bilaterally stable from the previous exam. No focal acute infiltrate or sizable effusion is noted. No bony abnormality is noted. IMPRESSION: Chronic changes without acute abnormality. Electronically Signed   By: Alcide Clever M.D.   On: 05/14/2017 14:45   Dg Chest 2 View  Result Date: 05/10/2017 CLINICAL DATA:  Fever and cough for 1 day EXAM: CHEST - 2 VIEW COMPARISON:  11/25/2016 FINDINGS: Cardiac shadow is stable. Persistent scarring is noted particularly in the apices bilaterally. No focal infiltrate or sizable effusion is seen. No bony abnormality is noted. IMPRESSION: Chronic scarring particularly in the apices bilaterally. No acute abnormality noted. Electronically Signed   By: Alcide Clever  M.D.   On: 05/10/2017 10:41    Scheduled Meds: . budesonide  0.25 mg Nebulization BID  . enoxaparin (LOVENOX) injection  40 mg Subcutaneous Q24H  . folic acid  2 mg Oral Daily  . oxyCODONE  10 mg Oral Q4H   Continuous Infusions: . sodium chloride      Active Problems:   Functional asplenia   Influenza A   Hb-SS disease with vaso-occlusive crisis (HCC)    In excess of 35 minutes spent during this visit. Greater than 50% involved face to face contact with the patient for assessment, counseling and coordination of care.

## 2017-05-15 NOTE — Progress Notes (Signed)
  Echocardiogram 2D Echocardiogram has been performed.  Leta JunglingCooper, Madisan Bice M 05/15/2017, 12:07 PM

## 2017-05-15 NOTE — Progress Notes (Signed)
Lab tech unable to obtain labs this morning after multiple sticks, suggested oral hydration, will tryagain later

## 2017-05-15 NOTE — CV Procedure (Signed)
Echocardiogram not completed per the patient request. "She doesn't feel well and just wants to sleep". Will try again later. Leta Junglingiffany Coretha Creswell RDCS

## 2017-05-16 DIAGNOSIS — K5900 Constipation, unspecified: Secondary | ICD-10-CM

## 2017-05-16 DIAGNOSIS — R112 Nausea with vomiting, unspecified: Secondary | ICD-10-CM

## 2017-05-16 LAB — CBC WITH DIFFERENTIAL/PLATELET
BASOS PCT: 0 %
Basophils Absolute: 0 10*3/uL (ref 0.0–0.1)
EOS PCT: 0 %
Eosinophils Absolute: 0 10*3/uL (ref 0.0–0.7)
HEMATOCRIT: 21.4 % — AB (ref 36.0–46.0)
HEMOGLOBIN: 7.3 g/dL — AB (ref 12.0–15.0)
LYMPHS PCT: 58 %
Lymphs Abs: 6.3 10*3/uL — ABNORMAL HIGH (ref 0.7–4.0)
MCH: 29.4 pg (ref 26.0–34.0)
MCHC: 34.1 g/dL (ref 30.0–36.0)
MCV: 86.3 fL (ref 78.0–100.0)
Monocytes Absolute: 1 10*3/uL (ref 0.1–1.0)
Monocytes Relative: 9 %
NRBC: 90 /100{WBCs} — AB
Neutro Abs: 3.6 10*3/uL (ref 1.7–7.7)
Neutrophils Relative %: 33 %
Platelets: 326 10*3/uL (ref 150–400)
RBC: 2.48 MIL/uL — ABNORMAL LOW (ref 3.87–5.11)
RDW: 30.1 % — ABNORMAL HIGH (ref 11.5–15.5)
WBC: 10.9 10*3/uL — ABNORMAL HIGH (ref 4.0–10.5)

## 2017-05-16 LAB — RETICULOCYTES
RBC.: 2.48 MIL/uL — ABNORMAL LOW (ref 3.87–5.11)
Retic Ct Pct: >23 % — ABNORMAL HIGH (ref 0.4–3.1)

## 2017-05-16 NOTE — Progress Notes (Signed)
DC instructions reviewed with patient. IV removed. Pt understands DC instructions. Transported to ride with sister via wheelchair.

## 2017-05-16 NOTE — Discharge Summary (Signed)
Valerie Reid MRN: [id number] DOB/AGE: 28-Jul-1990 27 y.o.  Admit date: 05/10/2017 Discharge date: 05/16/2017  Primary Care Physician:  Ledon Snare, MD   Discharge Diagnoses:   Patient Active Problem List   Diagnosis Date Noted  . Anemia of chronic disease 08/19/2015  . Airway hyperreactivity 03/27/2015  . FO (foramen ovale) 01/01/2013  . 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   folic acid 1 MG tablet Commonly known as:  FOLVITE Take 1 mg by mouth daily.   HYDROmorphone 2 MG tablet Commonly known as:  DILAUDID Take 1 tablet (2 mg total) by mouth every 4 (four) hours as needed for moderate pain or severe pain (except fro HA).   hydroxyurea 500 MG capsule Commonly known as:  HYDREA Take 2 capsules (1,000 mg total) by mouth daily. May take with food to minimize GI side effects.   naproxen 250 MG tablet Commonly known as:  NAPROSYN Take 1 tablet (250 mg total) by mouth 2 (two) times daily as needed for headache.         Consults:    SIGNIFICANT DIAGNOSTIC STUDIES:  Dg Chest 2 View  Result Date: 05/14/2017 CLINICAL DATA:  Hypoxia EXAM: CHEST - 2 VIEW COMPARISON:  05/10/2017 FINDINGS: Cardiac shadow is stable. The lungs are well aerated bilaterally. Chronic scarring is noted bilaterally stable from the previous exam. No focal acute infiltrate  or sizable effusion is noted. No bony abnormality is noted. IMPRESSION: Chronic changes without acute abnormality. Electronically Signed   By: Alcide Clever M.D.   On: 05/14/2017 14:45   Dg Chest 2 View  Result Date: 05/10/2017 CLINICAL DATA:  Fever and cough for 1 day EXAM: CHEST - 2 VIEW COMPARISON:  11/25/2016 FINDINGS: Cardiac shadow is stable. Persistent scarring is noted particularly in the apices bilaterally. No focal infiltrate or sizable effusion is seen. No bony abnormality is noted. IMPRESSION: Chronic scarring particularly in the apices bilaterally. No acute abnormality noted. Electronically Signed   By: Alcide Clever M.D.   On: 05/10/2017 10:41     ECHO:   Study Conclusions  - Left ventricle: The cavity size was normal. Wall thickness was   normal. Systolic function was normal. The estimated ejection   fraction was in the range of 55% to 60%. Wall motion was normal;   there were no regional wall motion abnormalities. Left   ventricular diastolic function parameters were normal. - Aortic valve: There was trivial regurgitation. - Mitral valve: There was trivial regurgitation. - Right ventricle: The cavity size was moderately dilated. Wall   thickness was normal. - Right atrium: The atrium was moderately to severely dilated. - Pulmonary arteries: PA peak pressure: 51 mm Hg (S). - Pericardium, extracardiac: A trivial pericardial effusion was   identified posterior to the  heart     Recent Results (from the past 240 hour(s))  Culture, blood (Routine X 2) w Reflex to ID Panel     Status: None   Collection Time: 05/10/17  6:29 PM  Result Value Ref Range Status   Specimen Description   Final    BLOOD RIGHT ARM Performed at [facility], [address], [place], [place] [address]    Special Requests   Final    BAA BCAV Performed at [facility], [address] [place], [place] [address]    Culture   Final    NO GROWTH 5 DAYS Performed  at [facility], [address] [place], [place] [address]    Report Status 05/15/2017 FINAL  Final  Culture, blood (Routine X 2) w Reflex to ID Panel     Status: None   Collection Time: 05/10/17  6:29 PM  Result Value Ref Range Status   Specimen Description   Final    BLOOD RIGHT ARM Performed at [facility], [address], [place], [place] [address]    Special Requests   Final    BAA BCAV Performed at [facility], [address] [place], [place] [address]    Culture   Final    NO GROWTH 5 DAYS Performed at [facility], [address], [place], [place] [address]    Report Status 05/15/2017 FINAL  Final    BRIEF ADMITTING H & P: Pt with Sickle Cell Disease admitted with influenza. She describes a sudden onset of fever, myalgias and rigors. She also had an escalation of her sickle cell pain. She is unable to rate the intensity of her pain but she states that she feels the worst that she's ever felt. She states that she's afraid that she will die as she has heard of patients with SCD dying if they become very ill. She is unaware of any sick contacts. Presently she is febrile and c/o HA. She denies a stiff neck but does have photophobia associated with the HA.   In the ED she was febrile and tachycardic with some hypotension only after receiving IV Dilaudid. A rapid flu swab was positive for Influenza A. In the ED she received a dose of Toradol, 2 doses of Dilaudid by IV and IVF. I am asked to admit patient for SIRS due to influenza and also for Sickle Cell Crisis. Pt had an episode in the past with IV Dilaudid where she required Narcan and she is very afraid of IV opiates except with the use of the PCA. However she is too ill to actually use the PCA     Hospital Course:  Present on Admission: . (Resolved) Influenza A . (Resolved) Hb-SS disease with vaso-occlusive crisis (HCC) . (Resolved) Migraine headache . (Resolved)  Constipation  1. Influenza A: Pain: The patient was positive for influenza A.  Patient was treated with Tamiflu 75 mg twice daily for a total of 5 days.  She completed treatment.  At the time of discharge she is a without any sequela. 2. Hb SS with Crisis: The patient was initially treated with Toradol, IV fluids and oral Dilaudid and pulse IV Dilaudid due to her clinical condition that did not allow her to appropriately use the PCA.  Once the patient was feeling better she was placed on a Dilaudid PCA however that caused severe migraines.  She was placed back on oral Dilaudid however the migraine completely.  Treatment was changed over to oxycodone 10 mg and the pain improved and the headaches went away.  At the time of discharge the patient that she feels well with very little pain she is ambulatory and she is tolerating diet well. 3. 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  During the illness she had some acute hemolysis with a drop in her hemoglobin.  She received a transfusion of 1 unit of blood.  She also had a decrease reticulocytosis degree of anemia and the hydroxyurea was held for the remainder of her hospitalization.  At the time of discharge her reticulocyte count has improved as has her hemoglobin and she discharged home on her previous dose of hydroxyurea. 4. Elevated Troponin: The patient during the time of her anemia and pain had an elevated troponin with tachycardia and palpitations.  It was felt that this was a demand ischemia due to her anemia and her sickle cell crisis.  After her transfusion and the tachycardia improved and the troponin decreased.  Troponin peaked at 0.14 and decreased down to 0.8.  An echocardiogram was done that did not show any gross abnormalities.  Please see report above 5. Migraine HA: Patient typically has a daily migraine headache whenever she received IV opiates.  During that hospitalization she did have  significant migraine and due to the fact that she had feeding in the past could not the usual migraine headache.  She was treated with a cocktail of Toradol, Reglan and IV Benadryl.  Had some improvement with her headache.  However the headaches will improve when she was switched from the oral Dilaudid over to oxycodone.  At the time of discharge she is without any headache she is eating well and ventilating without any difficulty. 6. Emesis: Patient had an opiate of her headache and likely constipation also.  However this is resolved and the patient tolerated diet she had several meals where she had no emesis. 7. Constipation: Patient with chronic constipation and was even more constipated doing treatment appropriate.  She received then, daily basis and did respond to laxative.  Patient had a bowel movement yesterday and since then has been feeling much better.  Disposition and Follow-up: She is discharged home in stable condition follow-up with Onalee Hua lady, positioned at the hematology department at [facility].  Otherwise she is to follow-up with primary care physician. Discharge Instructions    Activity as tolerated - No restrictions   Complete by:  As directed    Diet general   Complete by:  As directed       DISCHARGE EXAM:  General: Alert, awake, oriented x 3 and in no apparent distress. HEENT: [place]/AT PEERL, EOMI, mild icterus Neck: Trachea midline, no masses, no thyromegal,y no JVD, no carotid bruit OROPHARYNX: Moist, No exudate/ erythema/lesions.  Heart: Regular rate and rhythm.  There is a II/nonradiating ejection murmur at the hospital.  There are no rubs, gallops or S3 and PMI is non-displaced. Exam reveals no decreased pulses. Pulmonary/Chest: Normal effort. Breath sounds normal. No. Apnea. Clear to auscultation,no stridor,  no wheezing and no rhonchi noted. No respiratory distress and no tenderness noted. Abdomen: Soft, nontender, nondistended, normal bowel sounds, no  masses no hepatosplenomegaly noted. No fluid wave and no ascites. There is no guarding or rebound. Neuro: Alert and oriented to person, place and time. Normal motor skills, Displays no atrophy or tremors and exhibits normal muscle tone.  No focal neurological deficits noted cranial nerves  II through XII grossly intact. No sensory deficit noted.  Strength at baseline in bilateral upper and lower extremities. Gait normal. Musculoskeletal: No warmth swelling or erythema around joints, no spinal tenderness noted. Psychiatric: Patient alert and oriented x3, good insight and cognition, good recent to remote recall. Mood, affect and judgement normal     Blood pressure 100/64, pulse (!) 52, temperature 98.4 F (36.9 C), temperature source Oral, resp. rate 16, height 5\' 2"  (1.575 m), weight 49.1 kg (108 lb 3.9 oz), last menstrual period 05/02/2017, SpO2 92 %.  Recent Labs    05/15/17 1017  NA 141  K 3.5  CL 105  CO2 28  GLUCOSE 102*  BUN <5*  CREATININE 0.34*  CALCIUM 8.5*   No results for input(s): AST, ALT, ALKPHOS, BILITOT, PROT, ALBUMIN in the last 72 hours. No results for input(s): LIPASE, AMYLASE in the last 72 hours. Recent Labs    05/13/17 1803 05/14/17 1727 05/15/17 1017  WBC 10.3  --  9.4  NEUTROABS 7.2  --  2.3  HGB 5.5* 6.9* 6.7*  HCT 15.1* 19.6* 19.7*  MCV 83.4  --  84.9  PLT 214  --  286     Total time spent including face to face and decision making was greater than 30 minutes  Signed: Augie Vane A. 05/16/2017, 12:18 PM

## 2017-05-16 NOTE — Progress Notes (Addendum)
Pt c/o headache she rates a 9/10 on pain scale. She denies chest pain at this time. She states she has this headache on most days. She states the headaches can be brought on by warm weather. She states she regularly takes alieve twice a day for her headache. She states she has had headaches since she was a child, and that Valerie Reid has done MRIs (she said it was a tunnel with loud click noises) in the past.

## 2017-05-16 NOTE — Progress Notes (Signed)
Patient walked in hallway, maintained oxygen about 92%

## 2018-12-26 IMAGING — DX DG CHEST 2V
2 series · 2 of 2 positions shown · non-contrast
Comparison: 05/10/2017

CLINICAL DATA: Hypoxia

EXAM:
CHEST - 2 VIEW

[chest pa]
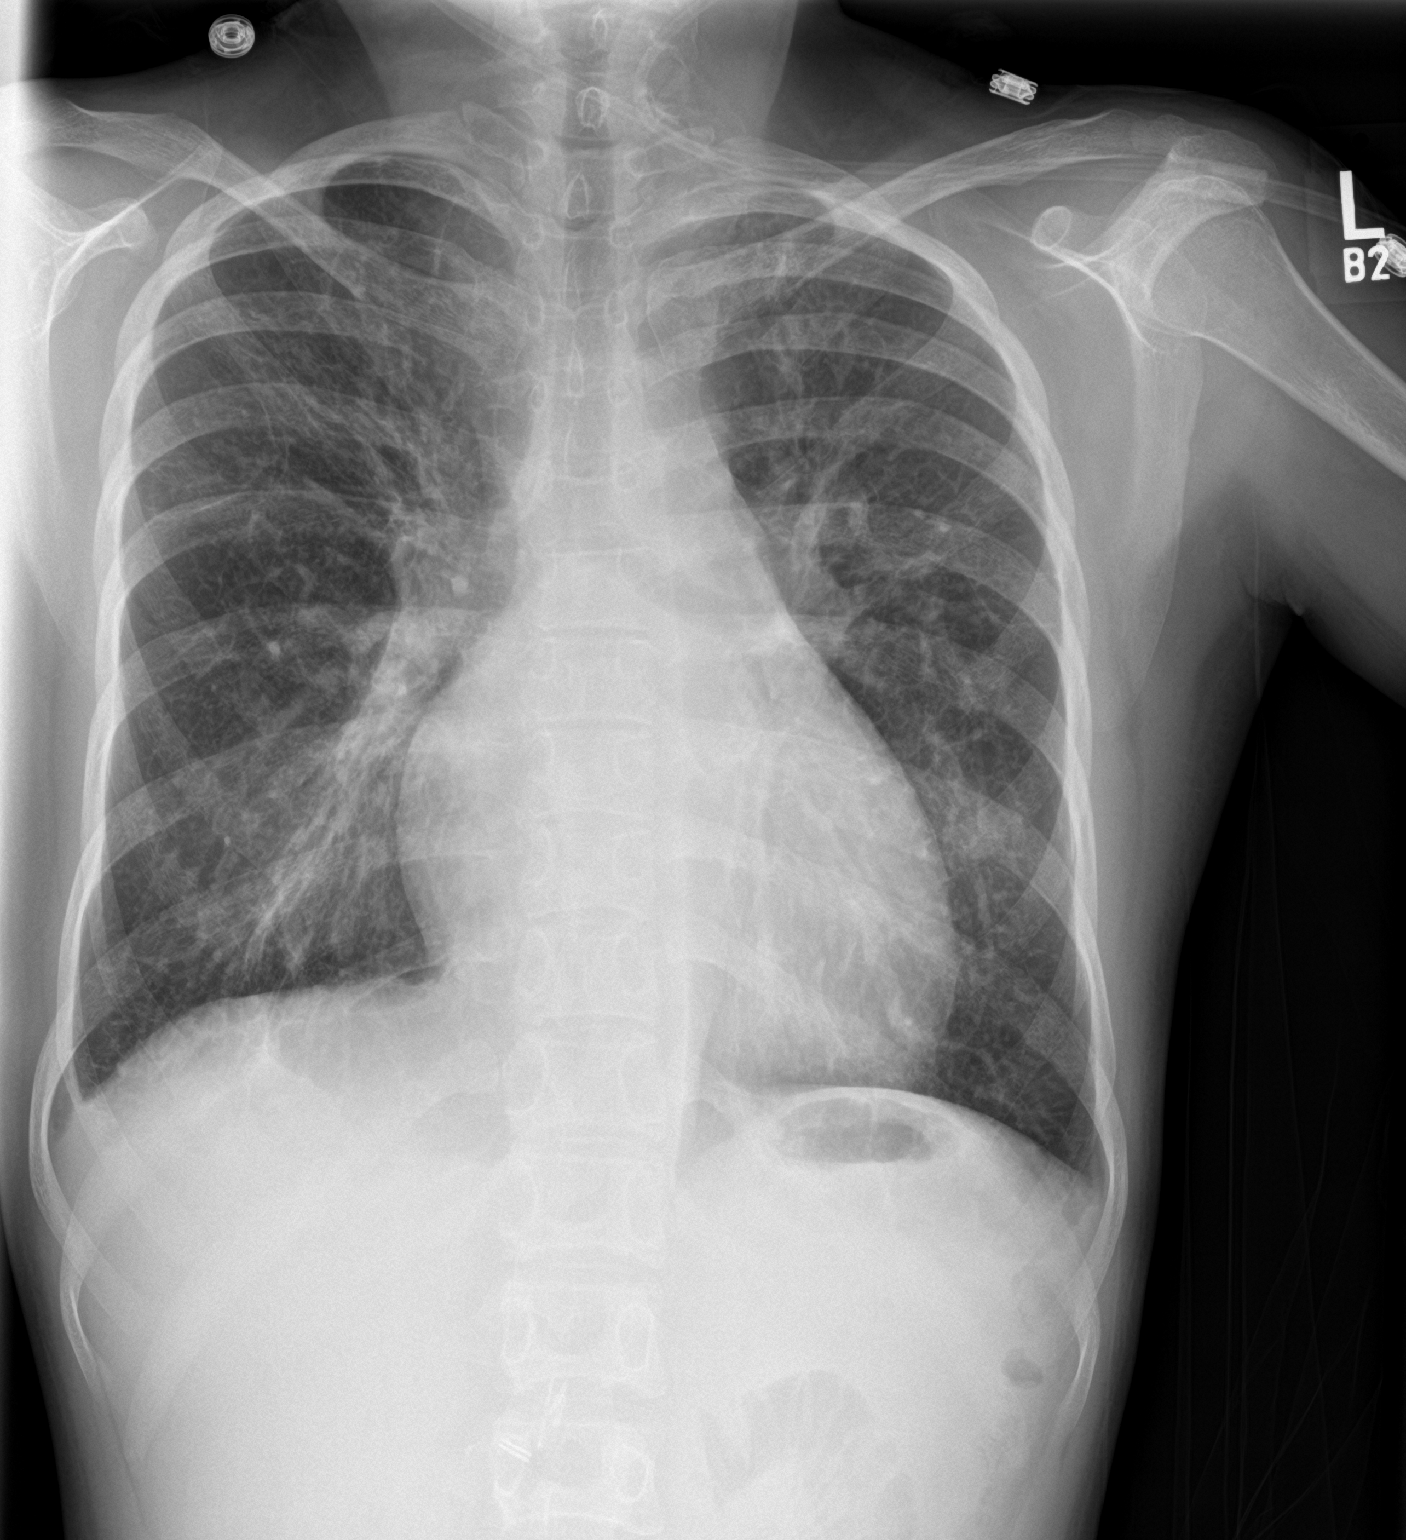

[chest lat]
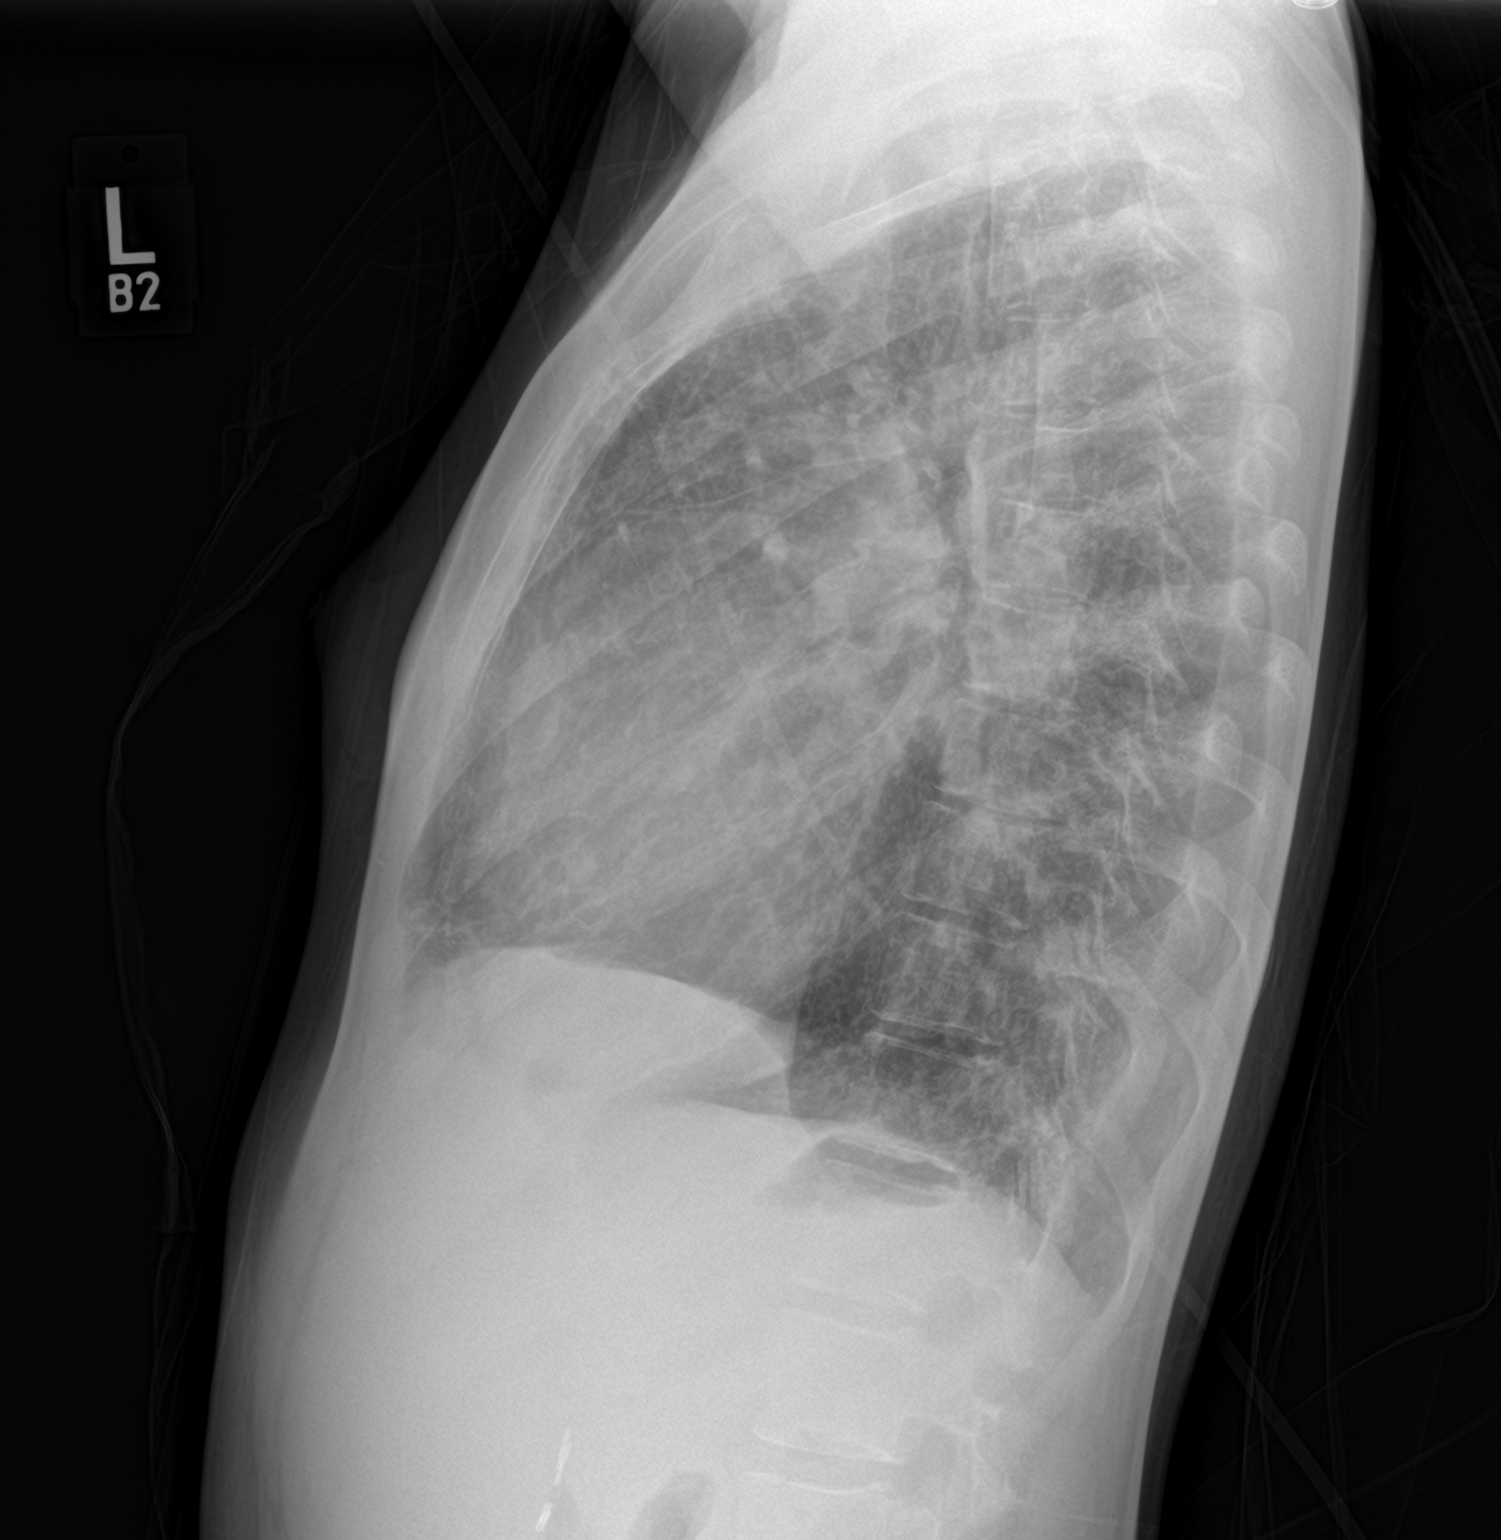

[2 of 2 positions shown; findings below may reference images not displayed]

FINDINGS: Cardiac shadow is stable. The lungs are well aerated bilaterally.
Chronic scarring is noted bilaterally stable from the previous exam.
No focal acute infiltrate or sizable effusion is noted. No bony
abnormality is noted.
IMPRESSION: Chronic changes without acute abnormality.
